# Patient Record
Sex: Female | Born: 1975 | Race: White | Hispanic: No | Marital: Married | Smoking: Never smoker
Health system: Southern US, Community
[De-identification: ages and names within clinical notes are randomized; demographics above are authoritative.]

## PROBLEM LIST (undated history)

## (undated) DIAGNOSIS — N200 Calculus of kidney: Secondary | ICD-10-CM

## (undated) DIAGNOSIS — R000 Tachycardia, unspecified: Secondary | ICD-10-CM

## (undated) DIAGNOSIS — A6920 Lyme disease, unspecified: Secondary | ICD-10-CM

## (undated) DIAGNOSIS — B2790 Infectious mononucleosis, unspecified without complication: Secondary | ICD-10-CM

## (undated) DIAGNOSIS — G510 Bell's palsy: Secondary | ICD-10-CM

## (undated) HISTORY — DX: Bell's palsy: G51.0

## (undated) HISTORY — PX: LASIK: SHX215

## (undated) HISTORY — DX: Calculus of kidney: N20.0

## (undated) HISTORY — DX: Lyme disease, unspecified: A69.20

## (undated) HISTORY — PX: OTHER SURGICAL HISTORY: SHX169

## (undated) HISTORY — DX: Infectious mononucleosis, unspecified without complication: B27.90

## (undated) HISTORY — PX: NEPHRECTOMY: SHX65

---

## 2004-06-14 ENCOUNTER — Ambulatory Visit (HOSPITAL_COMMUNITY): Admission: RE | Admit: 2004-06-14 | Discharge: 2004-06-14 | Payer: Self-pay | Admitting: Urology

## 2004-07-29 ENCOUNTER — Other Ambulatory Visit: Admission: RE | Admit: 2004-07-29 | Discharge: 2004-07-29 | Payer: Self-pay | Admitting: Obstetrics and Gynecology

## 2006-05-11 ENCOUNTER — Ambulatory Visit (HOSPITAL_BASED_OUTPATIENT_CLINIC_OR_DEPARTMENT_OTHER): Admission: RE | Admit: 2006-05-11 | Discharge: 2006-05-11 | Payer: Self-pay | Admitting: Urology

## 2006-12-26 ENCOUNTER — Ambulatory Visit: Payer: Self-pay | Admitting: Cardiovascular Disease

## 2006-12-26 ENCOUNTER — Other Ambulatory Visit: Payer: Self-pay | Admitting: Emergency Medicine

## 2006-12-26 ENCOUNTER — Inpatient Hospital Stay (HOSPITAL_COMMUNITY): Admission: AD | Admit: 2006-12-26 | Discharge: 2006-12-27 | Payer: Self-pay | Admitting: Obstetrics and Gynecology

## 2006-12-28 ENCOUNTER — Encounter: Payer: Self-pay | Admitting: Cardiovascular Disease

## 2006-12-28 ENCOUNTER — Ambulatory Visit: Payer: Self-pay

## 2006-12-31 ENCOUNTER — Ambulatory Visit: Payer: Self-pay | Admitting: Cardiovascular Disease

## 2007-01-14 ENCOUNTER — Ambulatory Visit: Payer: Self-pay | Admitting: Cardiovascular Disease

## 2007-02-04 ENCOUNTER — Inpatient Hospital Stay (HOSPITAL_COMMUNITY): Admission: AD | Admit: 2007-02-04 | Discharge: 2007-02-07 | Payer: Self-pay | Admitting: Obstetrics and Gynecology

## 2008-08-21 ENCOUNTER — Ambulatory Visit: Payer: Self-pay | Admitting: Cardiology

## 2008-11-25 ENCOUNTER — Telehealth (INDEPENDENT_AMBULATORY_CARE_PROVIDER_SITE_OTHER): Payer: Self-pay | Admitting: *Deleted

## 2008-11-30 ENCOUNTER — Telehealth: Payer: Self-pay | Admitting: Cardiology

## 2008-11-30 ENCOUNTER — Telehealth (INDEPENDENT_AMBULATORY_CARE_PROVIDER_SITE_OTHER): Payer: Self-pay | Admitting: Physician Assistant

## 2008-12-04 ENCOUNTER — Encounter: Payer: Self-pay | Admitting: Cardiology

## 2008-12-26 ENCOUNTER — Inpatient Hospital Stay (HOSPITAL_COMMUNITY): Admission: AD | Admit: 2008-12-26 | Discharge: 2008-12-26 | Payer: Self-pay | Admitting: Obstetrics and Gynecology

## 2009-01-12 ENCOUNTER — Inpatient Hospital Stay (HOSPITAL_COMMUNITY): Admission: AD | Admit: 2009-01-12 | Discharge: 2009-01-14 | Payer: Self-pay | Admitting: Obstetrics and Gynecology

## 2009-01-30 DEATH — deceased

## 2010-01-30 NOTE — L&D Delivery Note (Signed)
Delivery Note At 4:14 PM a viable female was delivered via Vaginal, Spontaneous Delivery (Presentation: Left Occiput Anterior).  APGAR: 9, 9; weight 6 lb 10.2 oz (3011 g).   Placenta status: Intact, Spontaneous.  Cord: 3 vessels with the following complications: None.  Cord pH and carolinas cord collection done.  Anesthesia: Epidural  Episiotomy: None Lacerations: None Suture Repair:  Est. Blood Loss (mL): 300  Mom to postpartum.  Baby to nursery-stable.  Brianna Alvarez C 09/01/2010, 6:01 PM

## 2010-05-02 LAB — CBC
HCT: 22.9 % — ABNORMAL LOW (ref 36.0–46.0)
Hemoglobin: 7.6 g/dL — ABNORMAL LOW (ref 12.0–15.0)
Platelets: 191 10*3/uL (ref 150–400)
RBC: 2.48 MIL/uL — ABNORMAL LOW (ref 3.87–5.11)
WBC: 8.3 10*3/uL (ref 4.0–10.5)

## 2010-05-03 LAB — CBC
HCT: 31.2 % — ABNORMAL LOW (ref 36.0–46.0)
Hemoglobin: 10.5 g/dL — ABNORMAL LOW (ref 12.0–15.0)
Hemoglobin: 9.7 g/dL — ABNORMAL LOW (ref 12.0–15.0)
RBC: 3.19 MIL/uL — ABNORMAL LOW (ref 3.87–5.11)
RDW: 13.9 % (ref 11.5–15.5)
WBC: 12.5 10*3/uL — ABNORMAL HIGH (ref 4.0–10.5)

## 2010-05-03 LAB — CYTOMEGALOVIRUS ANTIBODY, IGG: Cytomegalovirus(CMV) Antibody, IgG: POSITIVE — AB

## 2010-05-03 LAB — KLEIHAUER-BETKE STAIN: # Vials RhIg: 2

## 2010-05-03 LAB — HSV 1 ANTIBODY, IGG: HSV 1 Glycoprotein G Ab, IgG: <0.01 IV

## 2010-05-03 LAB — T3 UPTAKE: T3 Uptake Ratio: 18.8 % — ABNORMAL LOW (ref 22.5–37.0)

## 2010-05-03 LAB — LUPUS ANTICOAGULANT PANEL: PTT Lupus Anticoagulant: 38.7 secs (ref 32.0–43.4)

## 2010-05-03 LAB — T4, FREE: Free T4: 0.91 ng/dL (ref 0.80–1.80)

## 2010-05-03 LAB — TSH: TSH: 2.623 u[IU]/mL (ref 0.350–4.500)

## 2010-05-03 LAB — CMV IGM: CMV IgM: 8.2 AU/mL (ref <30.0)

## 2010-05-03 LAB — T4: T4, Total: 10.6 ug/dL (ref 5.0–12.5)

## 2010-06-14 NOTE — Consult Note (Signed)
NAMEAKASIA, AHMAD           ACCOUNT NO.:  [id number]   MEDICAL RECORD NO.:  [id number]          PATIENT TYPE:  EMS   LOCATION:  ED                           FACILITY:  [facility]   PHYSICIAN:  Noralyn Pick. Eden Emms, MD, FACCDATE OF BIRTH:  [birth year]   DATE OF CONSULTATION:  DATE OF DISCHARGE:                                 CONSULTATION   HISTORY:  Ms. Anagnos is a 35 year old white female who was referred to  the emergency room by her OB/GYN secondary to reoccurring palpitations.  Ms. Espinoza describes at least a 2-week history of tachy palpitations.  The first occurred approximately 2 weeks ago and awakened her from  sleep.  She noticed a heart rate of approximately 160, and it was  regular.  She described impending doom and was very anxious.  She did  not have any chest discomfort, but she did have nausea, diaphoresis and  shortness of breath.  She was unable to lie down.  After vomiting once  and a vagal maneuver, her heart rate decreased to approximately 115.  This lasted approximately 20 minutes.   While at work today at [employer] she stated that while walking  down the hall she suddenly felt her legs becoming very, very weak.  She  was also diaphoretic, nauseated and short of breath.  She checked her  heart rate, and it was 155.  With rest, heart rate went down to the 110s  in about 5 minutes; however, symptoms occurred after lunch beginning  with a heart rate in the 140s.  Dr. Earnestine Leys briefly reviewed her EKG and  felt that she had a sinus tachycardia.  She spoke with her OB/GYN on the  phone, and she referred the patient to one of [facility] emergency  rooms for cardiac evaluation.   Since arrival at [facility] emergency room, her heart rate has been  maintained between the 90s and 110; however, nursing noted that with  ambulating to the bathroom her heart rate would increase up into the  140s without any specific symptoms.  In the emergency room, she has been  placed on a fetal monitor.   PAST MEDICAL HISTORY:  Allergies include sulfa.  Only medications prior  to presentation included prenatal multivitamin daily.  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  This is her first  pregnancy, and she plans on breast feeding.   SOCIAL HISTORY:  She resides in [place] with her husband.  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  She  does not have any other children.  She denies any tobacco, alcohol,  drugs or herbal medications or specific diet.  She states that prior to  her pregnancy she exercised regularly; however, this has significantly  decreased in the last 2 months.   FAMILY HISTORY:  Her mother is alive at 52 with a history of SVT.  Her  father at 42 alive and well, brother alive and well.  REVIEW OF SYSTEMS:  Notable for a 22-pound weight gain since her  pregnancy, rare dyspnea on exertion since her pregnancy, urinary  frequency.  Last menstrual period was on May 15, 2006 and she  described GERD system symptoms during her second trimester.  All other  points besides those mentioned above are unremarkable.   PHYSICAL EXAM:  GENERAL:  Well-nourished well-developed pleasant white  female in no apparent distress.  Husband is present.  VITAL SIGNS:  Temperature is 97.3, blood pressure 110/88, pulse is 103, respirations  20, weight 169.7.  HEENT:  Unremarkable.  NECK:  Supple without thyromegaly, adenopathy, JVD, carotid bruit.  CHEST:  Symmetrical excursion.  Clear to auscultation bilaterally  without rales, rhonchi or wheezing.  It is noted with sitting up her  heart rate increases up into the 120s.  HEART:  PMI is not displaced.  Regular rate and rhythm.  I do not  appreciate any murmurs, rubs, clicks or gallops.  All pulses are  symmetrical  and intact without femoral bruit.  SKIN:  Integument appears to be intact.  ABDOMEN:  Gravida, bowel sounds present.  EXTREMITIES:  Negative cyanosis, clubbing or edema.  MUSCULOSKELETAL:  Grossly unremarkable.   A chest x-ray has not been performed.  EKG in the emergency room shows  normal sinus rhythm with a ventricular rate of 96, normal axis,  nonspecific ST-T wave changes, early R normal intervals.  No indications  for WPW or reentry tachycardia at this time.  H&H was 9.9 and 28.3  respectively with normal indices, platelets 239, WBCs 11.0, sodium 137,  potassium 3.5, BUN 7, creatinine 0.72, glucose 91, normal LFTs.  TSH and  free T4 are pending at the time of this dictation.  The patient also  notes that her hemoglobin was 11.1 approximately 5 weeks ago.   IMPRESSION:  1. Symptomatic SVT as described above.  2. Normocytic anemia, decreased per patient's report.  33 weeks      gravida.   DISPOSITION:  Dr. Eden Emms reviewed the patient's history, spoke with and  examined the patient and agrees with the above.  Given her SVT and low  hemoglobin, it is felt that she should be observed overnight.  Given her  pregnancy, it is felt that she would best be admitted to [facility]  for observation.  I have contacted Dr. Vincente Poli, and she has agreed to  accept the patient to a telemetry bed at [facility] for overnight  observation.  TSH and free T4 are pending at the time of this dictation.  For refractory tachycardia that does not subside with rest, severely  symptomatic could use adenosine or beta blockers; however, we will not  initiate a beta blocker at this time.  I have left a message at the  office to arrange an outpatient echocardiogram for Friday in our office  and also for an event monitor to be placed with a followup with Dr.  Eden Emms in [place] per patient's request.      Joellyn Rued, PA-C      Noralyn Pick. Eden Emms, MD, [facility]  Electronically Signed    EW/MEDQ   D:  12/26/2006  T:  12/27/2006  Job:  [id number]   cc:   Noralyn Pick. Eden Emms, MD, [facility]  [address]  [address]  [place]  [place] [address]   Dineen Kid. Rana Snare, M.D.  Fax: [phone]

## 2010-06-14 NOTE — Assessment & Plan Note (Signed)
[facility]                            CARDIOLOGY OFFICE NOTE   JANCY, SPRANKLE                    MRN:          [id number]  DATE:01/14/2007                            DOB:          [birth year]    Brianna Alvarez returns today for followup.  She has had tachy palpitations.  She is [redacted] weeks pregnant.  I believe she is going to have her baby at  around 38 weeks, since the baby is large.  She has had significant  palpitations.  She has had an event monitor.  They all appear to be  sinus tachycardia.  There is no evidence of PSVT, PAF, or significant  arrhythmias.  She is somewhat anemic and was placed on iron.  Her  thyroid function testing is normal.   She continues to work.  In fact, today she needs to go up to do stress  test with Dr. Andee Lineman at [facility].   She has cut bat to about a day-and-a-half a week.   I explained to Brianna Alvarez that I did not think any of her arrhythmias  were abnormal in mechanism.  I prefer not to start her on beta blockers,  although her OB/GYN doctor said it was okay.   I think she prefers not to be on beta blockers either.  Today in the  office I did check her pulse at rest.  It was 93.  With just a slight  walk up and down the hall, she went into sinus tachycardia at 120 and  there was a slow acceleration and slow deceleration.  This is consistent  with a benign mechanism.   She has not had any significant pre-syncope.   REVIEW OF SYSTEMS:  Remarkable for fatigue only.   Her current weight is 168, blood pressure 110/65.  She is not postural.  Resting heart rate is in the 90s.  HEENT:  Unremarkable.  Carotids normal without bruit.  No lymphadenopathy, thyromegaly, or JVP  elevation.  LUNGS:  Clear.  Good diaphragmatic motion.  No wheezing.  S1, S2 with no venous hum or abnormal murmurs.  ABDOMEN:  Protuberant consistent with gestational age.  Distal pulses  intact.  No edema.  NEURO:  Nonfocal.  No muscular  weakness.  SKIN:  Warm and dry.   Only medications include prenatal vitamins and iron.   She is allergic to SULFA.   IMPRESSION:  1. Palpitations in the setting of pregnancy.  Sinus tachycardia      consistent with volume overload and anemia.  No indication for beta      blockers.  2. [redacted] weeks pregnant in the setting of tachy palpitations.  I      discussed with OB/GYN doctor.  The patient likely to have her baby      2 weeks early given the size of the baby.  She knows to lie on her      left side if she were to be presyncopal to increase venous return.  3. Anemia in the setting of pregnancy, some of it likely dilutional.      Continue iron therapy.  Follow up with primary  care doctor.   She has had a 2D echocardiogram, which was normal and showed no evidence  of structural heart disease.  All of her transtelephonic monitoring was  also reviewed, approximate time 10 minutes.   I told Brianna Alvarez I thought she would be fine off of medications.  She  will have her baby in about 3 weeks and I will see her in 6 to 8 weeks.     Noralyn Pick. Eden Emms, MD, [facility]  Electronically Signed    PCN/MedQ  DD: 01/14/2007  DT: 01/14/2007  Job #: [id number]

## 2010-06-14 NOTE — Assessment & Plan Note (Signed)
[facility]                                 ON-CALL NOTE   Brianna, Alvarez                    MRN:          [id number]  DATE:01/04/2007                            DOB:          1975-11-08    PRIMARY PHYSICIAN:  Charlton Haws.   TIME OF CALL:  0005 hours.   I received a telephone call from [facility] to tell me  that [name] contacted them.  Per their report, the patient  was awakened by rapid heartbeat.  The patient is wearing a monitor  staffed by the [facility].  The monitoring service staff tell me  that this tracing demonstrated a regular narrow complex tachycardia that  appeared to be sinus tachycardia with a heart rate ranging from 99 to  156 beats per minute.  The patient had a followup tracing that  demonstrated sinus tachycardia at a rate of 115 beats per minute.  The  patient monitoring service tells me that the patient reported feeling  otherwise well, just concerned about her rapid heart beat.   I attempted to contact the patient at the telephone number listed in the  [facility] record system, as well as the phone number on file with [facility], however, there was an answering machine that answered and I did  not leave a message.     Lanell Matar, DO  Electronically Signed    KRD/MedQ  DD: 01/04/2007  DT: 01/04/2007  Job #: [id number]

## 2010-06-17 NOTE — Op Note (Signed)
Brianna Alvarez, Brianna Alvarez           ACCOUNT NO.:  [id number]   MEDICAL RECORD NO.:  [id number]          PATIENT TYPE:  AMB   LOCATION:  [facility]                         FACILITY:  [facility]   PHYSICIAN:  Bertram Millard. 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   PRINCIPAL PROCEDURE:  Cysto, left retrograde ureterogram, cauterization  of left ureter and ureteral orifice.   SURGEON:  Bertram Millard. Dahlstedt, M.D.   ANESTHESIA:  General with LMA.   COMPLICATIONS:  None.   BRIEF HISTORY:  A 35 year old female who had a left nephrectomy in the  recent past by Dr. Katina Dung, in [place].  She had a nonfunctioning  chronically infected, small left kidney.  On the table during that  laparoscopic procedure, she had a cystogram which revealed no evidence  of reflux.  She had had no evidence for reflux in the past as well.  She  had a nuclear medicine VCUG done of upon presentation for recurrent  urinary tract infections and left flank pain.  She was found to have  reflux into her blind ending left ureter.  As the patient has had  persistent symptoms, recurrent urinary tract infections and unilateral  pain on that left side, it was recommended that we eventually do  something to ameliorate these infections.  This includes left  ureterectomy, placement of Deflux in her intertrigonal area or possible  cauterization of her left ureter and orifice.  I have consulted several  of my partners regarding treatment options.  It was recommended that we  try the latter of these, as it is more cost effective at the present  time and least invasive.  Risks and complications of cysto and  cauterization were discussed with the patient.  She understands these  and desired to proceed.   DESCRIPTION OF PROCEDURE:  Mr. Pajak was administered preoperative IV  antibiotics, her surgical side marked in the holding area and she was  taken to the operating room where general anesthetic was administered  using LMA.  She was placed in the dorsal lithotomy position.  Genitalia  and perineum were prepped and draped.  A 22-French panendoscope was  advanced into her bladder.  Ureteral orifices were normal in  configuration and location.  Clear urine was seen effluxing from the  right side.  No bladder lesions were noted.  An open-end catheter was  used to perform a retrograde.  This revealed a normal-appearing left  ureter with the blind end up near some clips that were present  radiographically.  I then passed a thin Bugbee electrode under  radiographic guidance all the way up to this area.  I turned the cautery  unit up to 60, and stepped on the pedal.  I slowly withdrew the paddle  and under fluoroscopic guidance was able to cauterize this tube all the  way down to the bladder.  The orifice was cauterized significantly, both  within the orifice an outside of it.  This created a nice contracted  ureteral orifice.   At this point, the procedure was terminated.  The patient was  administered Toradol, bladder was drained and the scope removed.  She  was taken to PACU in stable condition.  She will follow-up in 2 weeks.      Bertram Millard. Dahlstedt, M.D.  Electronically Signed     SMD/MEDQ  D:  05/11/2006  T:  05/11/2006  Job:  [id number]

## 2010-09-01 ENCOUNTER — Encounter (HOSPITAL_COMMUNITY): Payer: Self-pay | Admitting: Anesthesiology

## 2010-09-01 ENCOUNTER — Inpatient Hospital Stay (HOSPITAL_COMMUNITY)
Admission: RE | Admit: 2010-09-01 | Discharge: 2010-09-03 | DRG: 775 | Disposition: A | Discharge status: Home / Self Care | Payer: PRIVATE HEALTH INSURANCE | Source: Ambulatory Visit | Attending: Obstetrics and Gynecology | Admitting: Obstetrics and Gynecology

## 2010-09-01 ENCOUNTER — Encounter (HOSPITAL_COMMUNITY): Payer: Self-pay

## 2010-09-01 ENCOUNTER — Inpatient Hospital Stay (HOSPITAL_COMMUNITY): Admission: RE | Admit: 2010-09-01 | Payer: Self-pay | Source: Ambulatory Visit

## 2010-09-01 ENCOUNTER — Inpatient Hospital Stay (HOSPITAL_COMMUNITY): Payer: PRIVATE HEALTH INSURANCE | Admitting: Anesthesiology

## 2010-09-01 DIAGNOSIS — Z22330 Carrier of Group B streptococcus: Secondary | ICD-10-CM

## 2010-09-01 DIAGNOSIS — R000 Tachycardia, unspecified: Secondary | ICD-10-CM | POA: Diagnosis present

## 2010-09-01 DIAGNOSIS — O99892 Other specified diseases and conditions complicating childbirth: Principal | ICD-10-CM | POA: Diagnosis present

## 2010-09-01 HISTORY — DX: Tachycardia, unspecified: R00.0

## 2010-09-01 LAB — TYPE AND SCREEN: Antibody Screen: NEGATIVE

## 2010-09-01 LAB — CBC
Hemoglobin: 10.2 g/dL — ABNORMAL LOW (ref 12.0–15.0)
MCH: 30.4 pg (ref 26.0–34.0)
MCHC: 33.3 g/dL (ref 30.0–36.0)
Platelets: 188 10*3/uL (ref 150–400)
RDW: 13.5 % (ref 11.5–15.5)

## 2010-09-01 LAB — ABO/RH: RH Type: POSITIVE

## 2010-09-01 LAB — HIV ANTIBODY (ROUTINE TESTING W REFLEX): HIV: NONREACTIVE

## 2010-09-01 LAB — HEPATITIS B SURFACE ANTIGEN: Hepatitis B Surface Ag: NEGATIVE

## 2010-09-01 LAB — RPR: RPR: NONREACTIVE

## 2010-09-01 MED ORDER — WITCH HAZEL-GLYCERIN EX PADS: 1.0000 "application " | MEDICATED_PAD | CUTANEOUS | Status: DC | PRN

## 2010-09-01 MED ORDER — AMPICILLIN SODIUM 2 G IJ SOLR: 2.0000 g | Freq: Four times a day (QID) | INTRAMUSCULAR | Status: DC

## 2010-09-01 MED ORDER — ACETAMINOPHEN 325 MG PO TABS
650.0000 mg | ORAL_TABLET | ORAL | Status: DC | PRN
  Administered 2010-09-01: 650 mg via ORAL
  Filled 2010-09-01: qty 2

## 2010-09-01 MED ORDER — LACTATED RINGERS IV SOLN
INTRAVENOUS | Status: DC
  Administered 2010-09-01: 125 mL/h via INTRAVENOUS

## 2010-09-01 MED ORDER — ONDANSETRON HCL 4 MG/2ML IJ SOLN
4.0000 mg | Freq: Four times a day (QID) | INTRAMUSCULAR | Status: DC | PRN
  Administered 2010-09-01 (×2): 4 mg via INTRAVENOUS
  Filled 2010-09-01 (×2): qty 2

## 2010-09-01 MED ORDER — NALBUPHINE SYRINGE 5 MG/0.5 ML
5.0000 mg | INJECTION | INTRAMUSCULAR | Status: DC | PRN
  Filled 2010-09-01: qty 0.5

## 2010-09-01 MED ORDER — SODIUM CHLORIDE 0.9 % IV SOLN
2.0000 g | Freq: Four times a day (QID) | INTRAVENOUS | Status: DC
  Administered 2010-09-01 (×2): 2 g via INTRAVENOUS
  Filled 2010-09-01 (×4): qty 2000

## 2010-09-01 MED ORDER — LANOLIN HYDROUS EX OINT: TOPICAL_OINTMENT | CUTANEOUS | Status: DC | PRN

## 2010-09-01 MED ORDER — OXYTOCIN 20 UNITS IN LACTATED RINGERS INFUSION - SIMPLE
125.0000 mL/h | Freq: Once | INTRAVENOUS | Status: AC
  Administered 2010-09-01: 999 mL/h via INTRAVENOUS
  Filled 2010-09-01: qty 1000

## 2010-09-01 MED ORDER — TERBUTALINE SULFATE 1 MG/ML IJ SOLN: 0.2500 mg | Freq: Once | INTRAMUSCULAR | Status: DC | PRN

## 2010-09-01 MED ORDER — DIPHENHYDRAMINE HCL 50 MG/ML IJ SOLN: 12.5000 mg | INTRAMUSCULAR | Status: DC | PRN

## 2010-09-01 MED ORDER — LACTATED RINGERS IV SOLN
500.0000 mL | Freq: Once | INTRAVENOUS | Status: AC
  Administered 2010-09-01: 500 mL via INTRAVENOUS

## 2010-09-01 MED ORDER — ONDANSETRON HCL 4 MG PO TABS: 4.0000 mg | ORAL_TABLET | ORAL | Status: DC | PRN

## 2010-09-01 MED ORDER — OXYCODONE-ACETAMINOPHEN 5-325 MG PO TABS: 2.0000 | ORAL_TABLET | ORAL | Status: DC | PRN

## 2010-09-01 MED ORDER — FLEET ENEMA 7-19 GM/118ML RE ENEM: 1.0000 | ENEMA | RECTAL | Status: DC | PRN

## 2010-09-01 MED ORDER — OXYCODONE-ACETAMINOPHEN 5-325 MG PO TABS: 1.0000 | ORAL_TABLET | ORAL | Status: DC | PRN

## 2010-09-01 MED ORDER — CITRIC ACID-SODIUM CITRATE 334-500 MG/5ML PO SOLN: 30.0000 mL | ORAL | Status: DC | PRN

## 2010-09-01 MED ORDER — ZOLPIDEM TARTRATE 5 MG PO TABS: 5.0000 mg | ORAL_TABLET | Freq: Every evening | ORAL | Status: DC | PRN

## 2010-09-01 MED ORDER — PHENYLEPHRINE 40 MCG/ML (10ML) SYRINGE FOR IV PUSH (FOR BLOOD PRESSURE SUPPORT)
80.0000 ug | PREFILLED_SYRINGE | INTRAVENOUS | Status: DC | PRN
  Filled 2010-09-01: qty 5

## 2010-09-01 MED ORDER — SIMETHICONE 80 MG PO CHEW: 80.0000 mg | CHEWABLE_TABLET | ORAL | Status: DC | PRN

## 2010-09-01 MED ORDER — FENTANYL 2.5 MCG/ML BUPIVACAINE 1/10 % EPIDURAL INFUSION (WH - ANES)
14.0000 mL/h | INTRAMUSCULAR | Status: DC
  Administered 2010-09-01 (×2): 14 mL/h via EPIDURAL
  Filled 2010-09-01 (×2): qty 60

## 2010-09-01 MED ORDER — METOPROLOL TARTRATE 25 MG PO TABS
25.0000 mg | ORAL_TABLET | Freq: Two times a day (BID) | ORAL | Status: DC
  Filled 2010-09-01 (×3): qty 1

## 2010-09-01 MED ORDER — OXYTOCIN 20 UNITS IN LACTATED RINGERS INFUSION - SIMPLE
2.0000 m[IU]/min | INTRAVENOUS | Status: DC
  Administered 2010-09-01: 2 m[IU]/min via INTRAVENOUS

## 2010-09-01 MED ORDER — PRENATAL PLUS 27-1 MG PO TABS
1.0000 | ORAL_TABLET | Freq: Every day | ORAL | Status: DC
  Administered 2010-09-03: 1 via ORAL
  Filled 2010-09-01: qty 1

## 2010-09-01 MED ORDER — OXYTOCIN 20 UNITS IN LACTATED RINGERS INFUSION - SIMPLE: 125.0000 mL/h | INTRAVENOUS | Status: DC | PRN

## 2010-09-01 MED ORDER — MEDROXYPROGESTERONE ACETATE 150 MG/ML IM SUSP: 150.0000 mg | INTRAMUSCULAR | Status: DC | PRN

## 2010-09-01 MED ORDER — BENZOCAINE-MENTHOL 20-0.5 % EX AERO: 1.0000 "application " | INHALATION_SPRAY | CUTANEOUS | Status: DC | PRN

## 2010-09-01 MED ORDER — BENZOCAINE-MENTHOL 20-0.5 % EX AERO
INHALATION_SPRAY | CUTANEOUS | Status: AC
  Filled 2010-09-01: qty 56

## 2010-09-01 MED ORDER — IBUPROFEN 600 MG PO TABS
600.0000 mg | ORAL_TABLET | Freq: Four times a day (QID) | ORAL | Status: DC
  Administered 2010-09-01 – 2010-09-03 (×8): 600 mg via ORAL
  Filled 2010-09-01 (×8): qty 1

## 2010-09-01 MED ORDER — LIDOCAINE HCL (PF) 1 % IJ SOLN
30.0000 mL | INTRAMUSCULAR | Status: DC | PRN
  Filled 2010-09-01 (×2): qty 30

## 2010-09-01 MED ORDER — EPHEDRINE 5 MG/ML INJ
10.0000 mg | INTRAVENOUS | Status: DC | PRN
  Filled 2010-09-01 (×2): qty 4

## 2010-09-01 MED ORDER — PHENYLEPHRINE 40 MCG/ML (10ML) SYRINGE FOR IV PUSH (FOR BLOOD PRESSURE SUPPORT)
80.0000 ug | PREFILLED_SYRINGE | INTRAVENOUS | Status: DC | PRN
  Filled 2010-09-01 (×2): qty 5

## 2010-09-01 MED ORDER — MEASLES, MUMPS & RUBELLA VAC ~~LOC~~ INJ
0.5000 mL | INJECTION | Freq: Once | SUBCUTANEOUS | Status: DC
  Filled 2010-09-01: qty 0.5

## 2010-09-01 MED ORDER — SENNOSIDES-DOCUSATE SODIUM 8.6-50 MG PO TABS
2.0000 | ORAL_TABLET | Freq: Every day | ORAL | Status: DC
  Administered 2010-09-01 – 2010-09-02 (×2): 2 via ORAL

## 2010-09-01 MED ORDER — LACTATED RINGERS IV SOLN
500.0000 mL | INTRAVENOUS | Status: DC | PRN
  Administered 2010-09-01: 300 mL via INTRAVENOUS

## 2010-09-01 MED ORDER — ONDANSETRON HCL 4 MG/2ML IJ SOLN: 4.0000 mg | INTRAMUSCULAR | Status: DC | PRN

## 2010-09-01 MED ORDER — IBUPROFEN 600 MG PO TABS: 600.0000 mg | ORAL_TABLET | Freq: Four times a day (QID) | ORAL | Status: DC | PRN

## 2010-09-01 MED ORDER — DIPHENHYDRAMINE HCL 25 MG PO CAPS: 25.0000 mg | ORAL_CAPSULE | Freq: Four times a day (QID) | ORAL | Status: DC | PRN

## 2010-09-01 MED ORDER — DIBUCAINE 1 % RE OINT: 1.0000 "application " | TOPICAL_OINTMENT | RECTAL | Status: DC | PRN

## 2010-09-01 MED ORDER — EPHEDRINE 5 MG/ML INJ
10.0000 mg | INTRAVENOUS | Status: DC | PRN
  Filled 2010-09-01: qty 4

## 2010-09-01 MED ORDER — TETANUS-DIPHTH-ACELL PERTUSSIS 5-2.5-18.5 LF-MCG/0.5 IM SUSP: 0.5000 mL | Freq: Once | INTRAMUSCULAR | Status: DC

## 2010-09-01 NOTE — H&P (Signed)
Brianna Alvarez is a 35 y.o. female presenting for induction of labor.  Her pregnancy has been complicated by maternal tachycardia which has required limited physical activity and bedrest as well as prn atenolol.  Her last pregnancy was complicated by a 37 wk stillbirth.  She has been monitored weekly with NSTs and monthly ultrasounds and presents for IOL.  This was recommended by me, the patient requested this and MFM facutly has also recommended this.  She is also GBS positive. History OB History    Grav Para Term Preterm Abortions TAB SAB Ect Mult Living   3 2 2  0 0 0 0 0 0 1     Past Medical History  Diagnosis Date  . Tachycardia    Past Surgical History  Procedure Date  . Left nephronectomy    Family History: family history is not on file. Social History:  reports that she has never smoked. She has never used smokeless tobacco. She reports that she does not drink alcohol or use illicit drugs.  ROS    Blood pressure 106/66, pulse 117, temperature 98.3 F (36.8 C), resp. rate 20, height 5\' 6"  (1.676 m), weight 76.658 kg (169 lb), last menstrual period 12/16/2009. Exam Physical Exam  Prenatal labs: ABO, Rh:   Antibody: Negative (08/02 0000) Rubella:   RPR: Nonreactive (08/02 0000)  HBsAg: Negative (08/02 0000)  HIV: Non-reactive (08/02 0000)  GBS:     Assessment/Plan: Term pregnancy with history of 37wks demise Maternal tachycardia Anxiety GBS postive- IV amp now and throughout labotr. Plan Pitocin, AROM and anticipated SVD.  Riskd of labor and prematurity were discussed at length and patient gives her informed consent.    Aidyn Kellis C 09/01/2010, 7:54 AM

## 2010-09-01 NOTE — Anesthesia Preprocedure Evaluation (Signed)
Anesthesia Evaluation  Name, MR# and DOB Patient awake  General Assessment Comment  Reviewed: Allergy & Precautions, H&P  and Patient's Chart, lab work & pertinent test results  Airway Mallampati: II TM Distance: >3 FB Neck ROM: full    Dental No notable dental hx    Pulmonaryneg pulmonary ROS    clear to auscultation  pulmonary exam normal   Cardiovascular regular Normal Sinus tachycardia with pregnancySinus tachycardia with pregnancy:    Neuro/PsychNegative Neurological ROS Negative Psych ROS  GI/Hepatic/Renal negative GI ROS, negative Liver ROS, and negative Renal ROS (+)       Endo/Other  Negative Endocrine ROS (+)   Abdominal   Musculoskeletal  Hematology negative hematology ROS (+)   Peds  Reproductive/Obstetrics (+) Pregnancy   Anesthesia Other Findings             Anesthesia Physical Anesthesia Plan  ASA: II  Anesthesia Plan: Epidural   Post-op Pain Management:    Induction:   Airway Management Planned:   Additional Equipment:   Intra-op Plan:   Post-operative Plan:   Informed Consent: I have reviewed the patients History and Physical, chart, labs and discussed the procedure including the risks, benefits and alternatives for the proposed anesthesia with the patient or authorized representative who has indicated his/her understanding and acceptance.     Plan Discussed with:   Anesthesia Plan Comments:         Anesthesia Quick Evaluation

## 2010-09-01 NOTE — Anesthesia Procedure Notes (Signed)
Epidural Patient location during procedure: OB Start time: 09/01/2010 11:25 AM  Staffing Performed by: anesthesiologist   Preanesthetic Checklist Completed: patient identified, site marked, surgical consent, pre-op evaluation, timeout performed, IV checked, risks and benefits discussed and monitors and equipment checked  Epidural Patient position: sitting Prep: site prepped and draped and DuraPrep Patient monitoring: continuous pulse ox and blood pressure Approach: midline Injection technique: LOR saline  Needle:  Needle type: Tuohy  Needle gauge: 17 G Needle length: 9 cm Needle insertion depth: 5 cm cm Catheter type: closed end flexible Catheter size: 19 Gauge Catheter at skin depth: 10 cm Test dose: negative  Assessment Events: blood not aspirated, injection not painful, no injection resistance, negative IV test and no paresthesia  Additional Notes Patient identified.  Risk benefits discussed including failed block, incomplete pain control, headache, nerve damage, paralysis, blood pressure changes, nausea, vomiting, reactions to medication both toxic or allergic, and postpartum back pain.  Patient expressed understanding and wished to proceed.  All questions were answered.  Sterile technique used throughout procedure and epidural site dressed with sterile barrier dressing. No paresthesia or other complications noted. 5 minutes prior to starting epidural infusion of fentanyl and bupivicaine, the epidural was loaded with 9 ml of 1.5% lidocaine in three separate doses.  The patient did not experience any signs of intravascular injection such as tinnitus or metallic taste in mouth nor signs of intrathecal spread such as rapid motor block. Please see nursing notes for vital signs.

## 2010-09-02 LAB — CBC
HCT: 27.6 % — ABNORMAL LOW (ref 36.0–46.0)
MCHC: 32.6 g/dL (ref 30.0–36.0)
RDW: 13.7 % (ref 11.5–15.5)

## 2010-09-02 NOTE — Progress Notes (Signed)
Mother pumping every 3 hrs. Mother has PIS at home. Support and encouragement gvien. Mother wanted information on taking Ambien , printed out info from Meds and [employer]. Ambien L-3.Inst mother to discuss with NICU Dr.

## 2010-09-02 NOTE — Progress Notes (Signed)
Post Partum Day 1 Subjective: up ad lib, voiding and tolerating PO, baby in NICU for transient tachypnea  Objective: Blood pressure 95/58, pulse 63, temperature 97.8 F (36.6 C), temperature source Oral, resp. rate 18, height 5\' 6"  (1.676 m), weight 76.658 kg (169 lb), last menstrual period 12/16/2009, SpO2 99.00%, unknown if currently breastfeeding.  Physical Exam:  General: alert and cooperative Lochia: appropriate Uterine Fundus: firm Perineum intact DVT Evaluation: No evidence of DVT seen on physical exam.   Basename 09/02/10 0510 09/01/10 0800  HGB 9.0* 10.2*  HCT 27.6* 30.6*    Assessment/Plan: Plan for discharge tomorrow   LOS: 1 day   CURTIS,CAROL G 09/02/2010, 8:43 AM

## 2010-09-03 NOTE — Anesthesia Postprocedure Evaluation (Signed)
Anesthesia Post Note  Patient: Brianna Alvarez  Procedure(s) Performed: * No procedures listed *  Anesthesia type: Epidural  Patient location: Mother/Baby  Post pain: Pain level controlled  Post assessment: Post-op Vital signs reviewed  Last Vitals:  Filed Vitals:   09/03/10 0513  BP: 96/62  Pulse: 65  Temp: 98 F (36.7 C)  Resp: 18    Post vital signs: Reviewed  Level of consciousness: awake  Complications: No apparent anesthesia complications

## 2010-09-03 NOTE — Progress Notes (Signed)
Post Partum Day 2 Subjective: no complaints  Objective: Blood pressure 96/62, pulse 65, temperature 98 F (36.7 C), temperature source Oral, resp. rate 18, height 5\' 6"  (1.676 m), weight 76.658 kg (169 lb), last menstrual period 12/16/2009, SpO2 97.00%, unknown if currently breastfeeding.  Physical Exam:  General: alert and cooperative Lochia: appropriate Uterine Fundus: firm DVT Evaluation: No evidence of DVT seen on physical exam.   Basename 09/02/10 0510 09/01/10 0800  HGB 9.0* 10.2*  HCT 27.6* 30.6*    Assessment/Plan: Discharge home Baby in NICU on CPAP Pt without complaints   LOS: 2 days   Nyree Applegate C 09/03/2010, 10:16 AM

## 2010-09-03 NOTE — Progress Notes (Signed)
Has PIS at home.  Explained how to use PIS while infant is in NICU.  Reports obtaining drops.  Encouragement given.  Educated to pump q2-3 hrs during day and at least once at night for a minimum of 8x/24hrs.  Engorgement prevention discussed.  Parents unsure of when infant will be d/c'd.  Encouraged to do skin-to-skin in NICU and explained importance of STS.  Encouraged to call for lactation support if needed in NICU for latching and after d/c'd if needed.

## 2010-09-03 NOTE — Discharge Summary (Signed)
Obstetric Discharge Summary Reason for Admission: induction of labor Prenatal Procedures: none Intrapartum Procedures: spontaneous vaginal delivery Postpartum Procedures: none Complications-Operative and Postpartum: none Hemoglobin  Date Value Range Status  09/02/2010 9.0* 12.0-15.0 (g/dL) Final     HCT  Date Value Range Status  09/02/2010 27.6* 36.0-46.0 (%) Final    Discharge Diagnoses: Term Pregnancy-delivered  Discharge Information: Date: 09/03/2010 Activity: pelvic rest Diet: routine Medications: PNV Condition: stable Instructions: refer to practice specific booklet Discharge to: home Follow-up Information    Follow up with Aadil Sur C, MD. Make an appointment in 6 weeks.   Contact information:   [address] [place] [address] [phone]          Newborn Data: Live born female  Birth Weight: 6 lb 10.2 oz (3011 g) APGAR: 9, 9  Home with Baby [place] is in NICU on CPAP for TTN.  Eddy Termine C 09/03/2010, 10:19 AM

## 2010-10-20 LAB — CBC
HCT: 27 — ABNORMAL LOW
MCHC: 35
Platelets: 206
RBC: 3.2 — ABNORMAL LOW
WBC: 10.1
WBC: 14.5 — ABNORMAL HIGH

## 2010-10-20 LAB — COMPREHENSIVE METABOLIC PANEL
ALT: 15
AST: 23
Albumin: 2 — ABNORMAL LOW
Alkaline Phosphatase: 101
BUN: 5 — ABNORMAL LOW
Chloride: 105
Potassium: 3.6
Total Bilirubin: 0.4

## 2010-10-20 LAB — RPR: RPR Ser Ql: NONREACTIVE

## 2010-11-08 LAB — CBC
HCT: 28.3 — ABNORMAL LOW
Hemoglobin: 9.9 — ABNORMAL LOW
MCHC: 34.9
RDW: 13.1

## 2010-11-08 LAB — COMPREHENSIVE METABOLIC PANEL
Alkaline Phosphatase: 95
BUN: 7
CO2: 25
Calcium: 8.2 — ABNORMAL LOW
GFR calc non Af Amer: >60
Glucose, Bld: 91
Total Protein: 5.8 — ABNORMAL LOW

## 2010-11-08 LAB — DIFFERENTIAL
Basophils Relative: 0
Lymphs Abs: 1.8
Monocytes Relative: 7
Neutro Abs: 8.4 — ABNORMAL HIGH
Neutrophils Relative %: 77

## 2010-11-08 LAB — URINALYSIS, ROUTINE W REFLEX MICROSCOPIC
Bilirubin Urine: NEGATIVE
Hgb urine dipstick: NEGATIVE
Ketones, ur: NEGATIVE
Protein, ur: NEGATIVE
Urobilinogen, UA: 1

## 2010-11-08 LAB — T4: T4, Total: 9.9

## 2010-11-08 LAB — RPR: RPR Ser Ql: NONREACTIVE

## 2010-11-08 LAB — TSH: TSH: 1.257

## 2013-10-03 ENCOUNTER — Other Ambulatory Visit: Payer: Self-pay | Admitting: Obstetrics and Gynecology

## 2013-10-07 LAB — CYTOLOGY - PAP

## 2013-12-01 ENCOUNTER — Encounter (HOSPITAL_COMMUNITY): Payer: Self-pay

## 2015-02-17 DIAGNOSIS — F4320 Adjustment disorder, unspecified: Secondary | ICD-10-CM | POA: Diagnosis not present

## 2015-06-29 DIAGNOSIS — N939 Abnormal uterine and vaginal bleeding, unspecified: Secondary | ICD-10-CM | POA: Diagnosis not present

## 2015-06-29 DIAGNOSIS — Z30432 Encounter for removal of intrauterine contraceptive device: Secondary | ICD-10-CM | POA: Diagnosis not present

## 2015-06-29 DIAGNOSIS — R102 Pelvic and perineal pain: Secondary | ICD-10-CM | POA: Diagnosis not present

## 2015-06-29 DIAGNOSIS — N951 Menopausal and female climacteric states: Secondary | ICD-10-CM | POA: Diagnosis not present

## 2015-06-29 DIAGNOSIS — Z1329 Encounter for screening for other suspected endocrine disorder: Secondary | ICD-10-CM | POA: Diagnosis not present

## 2015-06-29 DIAGNOSIS — N643 Galactorrhea not associated with childbirth: Secondary | ICD-10-CM | POA: Diagnosis not present

## 2015-09-30 DIAGNOSIS — Z30430 Encounter for insertion of intrauterine contraceptive device: Secondary | ICD-10-CM | POA: Diagnosis not present

## 2015-09-30 DIAGNOSIS — N841 Polyp of cervix uteri: Secondary | ICD-10-CM | POA: Diagnosis not present

## 2015-10-20 DIAGNOSIS — L811 Chloasma: Secondary | ICD-10-CM | POA: Diagnosis not present

## 2015-10-20 DIAGNOSIS — L700 Acne vulgaris: Secondary | ICD-10-CM | POA: Diagnosis not present

## 2015-12-02 DIAGNOSIS — Z23 Encounter for immunization: Secondary | ICD-10-CM | POA: Diagnosis not present

## 2016-02-28 MED FILL — ESCITALOPRAM 10 MG TABLET: 10 | 90 days supply | Qty: 90 | Fill #0

## 2016-03-15 DIAGNOSIS — D225 Melanocytic nevi of trunk: Secondary | ICD-10-CM | POA: Diagnosis not present

## 2016-03-15 DIAGNOSIS — L814 Other melanin hyperpigmentation: Secondary | ICD-10-CM | POA: Diagnosis not present

## 2016-03-15 DIAGNOSIS — D1800 Hemangioma unspecified site: Secondary | ICD-10-CM | POA: Diagnosis not present

## 2016-03-15 DIAGNOSIS — Z86018 Personal history of other benign neoplasm: Secondary | ICD-10-CM | POA: Diagnosis not present

## 2016-03-15 DIAGNOSIS — Z411 Encounter for cosmetic surgery: Secondary | ICD-10-CM | POA: Diagnosis not present

## 2016-04-27 DIAGNOSIS — F4320 Adjustment disorder, unspecified: Secondary | ICD-10-CM | POA: Diagnosis not present

## 2016-05-11 DIAGNOSIS — F4320 Adjustment disorder, unspecified: Secondary | ICD-10-CM | POA: Diagnosis not present

## 2016-05-25 DIAGNOSIS — F4320 Adjustment disorder, unspecified: Secondary | ICD-10-CM | POA: Diagnosis not present

## 2016-06-28 DIAGNOSIS — Z6823 Body mass index (BMI) 23.0-23.9, adult: Secondary | ICD-10-CM | POA: Diagnosis not present

## 2016-06-28 DIAGNOSIS — Z01419 Encounter for gynecological examination (general) (routine) without abnormal findings: Secondary | ICD-10-CM | POA: Diagnosis not present

## 2016-06-29 DIAGNOSIS — Z13220 Encounter for screening for lipoid disorders: Secondary | ICD-10-CM | POA: Diagnosis not present

## 2016-06-29 DIAGNOSIS — Z1329 Encounter for screening for other suspected endocrine disorder: Secondary | ICD-10-CM | POA: Diagnosis not present

## 2016-06-29 DIAGNOSIS — D509 Iron deficiency anemia, unspecified: Secondary | ICD-10-CM | POA: Diagnosis not present

## 2016-06-29 DIAGNOSIS — Z1231 Encounter for screening mammogram for malignant neoplasm of breast: Secondary | ICD-10-CM | POA: Diagnosis not present

## 2016-06-29 DIAGNOSIS — Z130 Encounter for screening for diseases of the blood and blood-forming organs and certain disorders involving the immune mechanism: Secondary | ICD-10-CM | POA: Diagnosis not present

## 2016-06-29 DIAGNOSIS — Z131 Encounter for screening for diabetes mellitus: Secondary | ICD-10-CM | POA: Diagnosis not present

## 2016-06-29 DIAGNOSIS — Z1321 Encounter for screening for nutritional disorder: Secondary | ICD-10-CM | POA: Diagnosis not present

## 2016-06-29 DIAGNOSIS — Z13228 Encounter for screening for other metabolic disorders: Secondary | ICD-10-CM | POA: Diagnosis not present

## 2016-10-25 DIAGNOSIS — Z23 Encounter for immunization: Secondary | ICD-10-CM | POA: Diagnosis not present

## 2016-11-07 ENCOUNTER — Telehealth: Payer: Self-pay | Admitting: Gastroenterology

## 2016-11-07 ENCOUNTER — Encounter: Payer: Self-pay | Admitting: Gastroenterology

## 2016-11-07 NOTE — Telephone Encounter (Signed)
Patty, Can you get in touch with her. She is a friend of mine, used to be a Marine scientist at [employer]. Her mother is a patient of mine and she has had numerous precancerous polyps. Brianna Alvarez needs a colonoscopy for strong family history of colon polyps. She turns 41 the end of November and so let's shoot for colonoscopy in early December sometime [place] will work great.

## 2016-11-08 NOTE — Telephone Encounter (Signed)
The pt has been scheduled for previsit and colon  

## 2017-01-02 DIAGNOSIS — M25572 Pain in left ankle and joints of left foot: Secondary | ICD-10-CM | POA: Diagnosis not present

## 2017-01-03 ENCOUNTER — Other Ambulatory Visit: Payer: Self-pay

## 2017-01-03 ENCOUNTER — Ambulatory Visit (AMBULATORY_SURGERY_CENTER): Payer: Self-pay | Admitting: *Deleted

## 2017-01-03 VITALS — Ht 66.0 in | Wt 149.8 lb

## 2017-01-03 DIAGNOSIS — Z800 Family history of malignant neoplasm of digestive organs: Secondary | ICD-10-CM

## 2017-01-03 MED ORDER — NA SULFATE-K SULFATE-MG SULF 17.5-3.13-1.6 GM/177ML PO SOLN: 1.0000 | Freq: Once | ORAL | 0 refills | Status: AC

## 2017-01-03 MED FILL — SUPREP BOWEL PREP KIT: 17.5-3.13-1 | 1 days supply | Qty: 354 | Fill #0

## 2017-01-03 NOTE — Progress Notes (Signed)
Denies allergies to eggs or soy products. Denies complications with sedation or anesthesia. Denies O2 use. Denies use of diet or weight loss medications.  Emmi instructions given for colonoscopy.  

## 2017-01-04 ENCOUNTER — Encounter: Payer: Self-pay | Admitting: Gastroenterology

## 2017-01-09 ENCOUNTER — Ambulatory Visit: Payer: PRIVATE HEALTH INSURANCE | Admitting: Gastroenterology

## 2017-01-10 ENCOUNTER — Encounter: Payer: Self-pay | Admitting: Gastroenterology

## 2017-01-10 ENCOUNTER — Other Ambulatory Visit: Payer: Self-pay

## 2017-01-10 ENCOUNTER — Ambulatory Visit (AMBULATORY_SURGERY_CENTER): Payer: 59 | Admitting: Gastroenterology

## 2017-01-10 VITALS — BP 99/56 | HR 62 | Temp 97.5°F | Resp 18 | Ht 66.0 in | Wt 149.0 lb

## 2017-01-10 DIAGNOSIS — Z8371 Family history of colonic polyps: Secondary | ICD-10-CM

## 2017-01-10 DIAGNOSIS — Z1211 Encounter for screening for malignant neoplasm of colon: Secondary | ICD-10-CM

## 2017-01-10 MED ORDER — SODIUM CHLORIDE 0.9 % IV SOLN: 500.0000 mL | INTRAVENOUS | Status: DC

## 2017-01-10 NOTE — Patient Instructions (Signed)
YOU HAD AN ENDOSCOPIC PROCEDURE TODAY AT THE [facility]:   Refer to the procedure report that was given to you for any specific questions about what was found during the examination.  If the procedure report does not answer your questions, please call your gastroenterologist to clarify.  If you requested that your care partner not be given the details of your procedure findings, then the procedure report has been included in a sealed envelope for you to review at your convenience later.  YOU SHOULD EXPECT: Some feelings of bloating in the abdomen. Passage of more gas than usual.  Walking can help get rid of the air that was put into your GI tract during the procedure and reduce the bloating. If you had a lower endoscopy (such as a colonoscopy or flexible sigmoidoscopy) you may notice spotting of blood in your stool or on the toilet paper. If you underwent a bowel prep for your procedure, you may not have a normal bowel movement for a few days.  Please Note:  You might notice some irritation and congestion in your nose or some drainage.  This is from the oxygen used during your procedure.  There is no need for concern and it should clear up in a day or so.  SYMPTOMS TO REPORT IMMEDIATELY:   Following lower endoscopy (colonoscopy or flexible sigmoidoscopy):  Excessive amounts of blood in the stool  Significant tenderness or worsening of abdominal pains  Swelling of the abdomen that is new, acute  Fever of 100F or higher  For urgent or emergent issues, a gastroenterologist can be reached at any hour by calling [phone].   DIET:  We do recommend a small meal at first, but then you may proceed to your regular diet.  Drink plenty of fluids but you should avoid alcoholic beverages for 24 hours.  MEDICATIONS:  Continue present medications.  ACTIVITY:  You should plan to take it easy for the rest of today and you should NOT DRIVE or use heavy machinery until tomorrow (because of the  sedation medicines used during the test).    FOLLOW UP: Our staff will call the number listed on your records the next business day following your procedure to check on you and address any questions or concerns that you may have regarding the information given to you following your procedure. If we do not reach you, we will leave a message.  However, if you are feeling well and you are not experiencing any problems, there is no need to return our call.  We will assume that you have returned to your regular daily activities without incident.  If any biopsies were taken you will be contacted by phone or by letter within the next 1-3 weeks.  Please call us at [phone] if you have not heard about the biopsies in 3 weeks.   Thank you for allowing us to provide for your healthcare needs today.   SIGNATURES/CONFIDENTIALITY: You and/or your care partner have signed paperwork which will be entered into your electronic medical record.  These signatures attest to the fact that that the information above on your After Visit Summary has been reviewed and is understood.  Full responsibility of the confidentiality of this discharge information lies with you and/or your care-partner. 

## 2017-01-10 NOTE — Op Note (Signed)
[facility] Patient Name: Brianna Alvarez Procedure Date: 01/10/2017 9:34 AM MRN: [id number] Endoscopist: Milus Banister , MD Age: 41 Referring MD:  Date of Birth: 08-31-1975 Gender: Female Account #: [id number] Procedure:                Colonoscopy Indications:              Colon cancer screening in patient at increased                            risk: Mother with multiple (15-20) precancerous                            polyps, genetic testing 2015 negative for known                            mutations Medicines:                Monitored Anesthesia Care Procedure:                Pre-Anesthesia Assessment:                           - Prior to the procedure, a History and Physical                            was performed, and patient medications and                            allergies were reviewed. The patient's tolerance of                            previous anesthesia was also reviewed. The risks                            and benefits of the procedure and the sedation                            options and risks were discussed with the patient.                            All questions were answered, and informed consent                            was obtained. Prior Anticoagulants: The patient has                            taken no previous anticoagulant or antiplatelet                            agents. ASA Grade Assessment: II - A patient with                            mild systemic disease. After reviewing the risks  and benefits, the patient was deemed in                            satisfactory condition to undergo the procedure.                           After obtaining informed consent, the colonoscope                            was passed under direct vision. Throughout the                            procedure, the patient's blood pressure, pulse, and                            oxygen saturations were monitored continuously. The                             Colonoscope was introduced through the anus and                            advanced to the the cecum, identified by                            appendiceal orifice and ileocecal valve. The                            colonoscopy was performed without difficulty. The                            patient tolerated the procedure well. The quality                            of the bowel preparation was excellent. The                            ileocecal valve, appendiceal orifice, and rectum                            were photographed. Scope In: 10:02:18 AM Scope Out: 10:15:12 AM Scope Withdrawal Time: 0 hours 9 minutes 44 seconds  Total Procedure Duration: 0 hours 12 minutes 54 seconds  Findings:                 The entire examined colon appeared normal on direct                            and retroflexion views. Complications:            No immediate complications. Estimated blood loss:                            None. Estimated Blood Loss:     Estimated blood loss: none. Impression:               - The entire examined colon is normal  on direct and                            retroflexion views.                           - No polyps or cancers. Recommendation:           - Patient has a contact number available for                            emergencies. The signs and symptoms of potential                            delayed complications were discussed with the                            patient. Return to normal activities tomorrow.                            Written discharge instructions were provided to the                            patient.                           - Resume previous diet.                           - Continue present medications.                           - Repeat colonoscopy in 10 years for screening                            purposes. Call if you have any concerns prior to                            then. Milus Banister, MD 01/10/2017  10:17:41 AM This report has been signed electronically.

## 2017-01-10 NOTE — Progress Notes (Signed)
Report given to PACU, vss 

## 2017-01-11 ENCOUNTER — Telehealth: Payer: Self-pay

## 2017-01-11 NOTE — Telephone Encounter (Signed)
  Follow up Call-  Call back number 01/10/2017  Post procedure Call Back phone  # [phone]  Permission to leave phone message Yes  Some recent data might be hidden     Patient questions:  Do you have a fever, pain , or abdominal swelling? No. Pain Score  0 *  Have you tolerated food without any problems? Yes.    Have you been able to return to your normal activities? Yes.    Do you have any questions about your discharge instructions: Diet   No. Medications  No. Follow up visit  No.  Do you have questions or concerns about your Care? No.  Actions: * If pain score is 4 or above: No action needed, pain <4.

## 2017-01-17 DIAGNOSIS — F4320 Adjustment disorder, unspecified: Secondary | ICD-10-CM | POA: Diagnosis not present

## 2017-02-20 DIAGNOSIS — F4320 Adjustment disorder, unspecified: Secondary | ICD-10-CM | POA: Diagnosis not present

## 2017-06-11 DIAGNOSIS — L700 Acne vulgaris: Secondary | ICD-10-CM | POA: Diagnosis not present

## 2017-06-11 DIAGNOSIS — M5481 Occipital neuralgia: Secondary | ICD-10-CM | POA: Diagnosis not present

## 2017-06-11 MED FILL — SPIRONOLACTONE 50 MG TABLET: 50 | 60 days supply | Qty: 30 | Fill #0

## 2017-08-13 DIAGNOSIS — Z6824 Body mass index (BMI) 24.0-24.9, adult: Secondary | ICD-10-CM | POA: Diagnosis not present

## 2017-08-13 DIAGNOSIS — Z01419 Encounter for gynecological examination (general) (routine) without abnormal findings: Secondary | ICD-10-CM | POA: Diagnosis not present

## 2017-08-13 DIAGNOSIS — Z1231 Encounter for screening mammogram for malignant neoplasm of breast: Secondary | ICD-10-CM | POA: Diagnosis not present

## 2017-08-14 ENCOUNTER — Other Ambulatory Visit: Payer: Self-pay | Admitting: Obstetrics and Gynecology

## 2017-08-14 DIAGNOSIS — R928 Other abnormal and inconclusive findings on diagnostic imaging of breast: Secondary | ICD-10-CM

## 2017-08-20 ENCOUNTER — Ambulatory Visit
Admission: RE | Admit: 2017-08-20 | Discharge: 2017-08-20 | Disposition: A | Discharge status: Home / Self Care | Payer: 59 | Source: Ambulatory Visit | Attending: Obstetrics and Gynecology | Admitting: Obstetrics and Gynecology

## 2017-08-20 DIAGNOSIS — R928 Other abnormal and inconclusive findings on diagnostic imaging of breast: Secondary | ICD-10-CM

## 2017-08-20 DIAGNOSIS — R922 Inconclusive mammogram: Secondary | ICD-10-CM | POA: Diagnosis not present

## 2017-08-20 DIAGNOSIS — N6001 Solitary cyst of right breast: Secondary | ICD-10-CM | POA: Diagnosis not present

## 2017-08-21 MED FILL — DOXYCYCLINE HYCLATE 100 MG: 100 | 30 days supply | Qty: 60 | Fill #0

## 2017-10-02 DIAGNOSIS — H5213 Myopia, bilateral: Secondary | ICD-10-CM | POA: Diagnosis not present

## 2017-10-17 DIAGNOSIS — Z23 Encounter for immunization: Secondary | ICD-10-CM | POA: Diagnosis not present

## 2017-10-18 DIAGNOSIS — H2511 Age-related nuclear cataract, right eye: Secondary | ICD-10-CM | POA: Diagnosis not present

## 2018-02-08 DIAGNOSIS — F4320 Adjustment disorder, unspecified: Secondary | ICD-10-CM | POA: Diagnosis not present

## 2018-03-04 DIAGNOSIS — J069 Acute upper respiratory infection, unspecified: Secondary | ICD-10-CM | POA: Diagnosis not present

## 2018-03-04 MED FILL — OSELTAMIVIR PHOSPHATE 75 MG: 75 | 5 days supply | Qty: 10 | Fill #0

## 2018-03-05 ENCOUNTER — Ambulatory Visit: Payer: 59 | Admitting: Physician Assistant

## 2018-04-05 ENCOUNTER — Ambulatory Visit: Payer: 59 | Admitting: Physician Assistant

## 2018-06-17 ENCOUNTER — Ambulatory Visit: Payer: 59 | Admitting: Family Medicine

## 2018-06-21 ENCOUNTER — Encounter: Payer: Self-pay | Admitting: Family Medicine

## 2018-06-21 ENCOUNTER — Ambulatory Visit (INDEPENDENT_AMBULATORY_CARE_PROVIDER_SITE_OTHER): Payer: 59 | Admitting: Family Medicine

## 2018-06-21 VITALS — HR 70 | Ht 66.0 in | Wt 150.0 lb

## 2018-06-21 DIAGNOSIS — Z800 Family history of malignant neoplasm of digestive organs: Secondary | ICD-10-CM | POA: Insufficient documentation

## 2018-06-21 DIAGNOSIS — Z905 Acquired absence of kidney: Secondary | ICD-10-CM | POA: Insufficient documentation

## 2018-06-21 DIAGNOSIS — Z975 Presence of (intrauterine) contraceptive device: Secondary | ICD-10-CM | POA: Diagnosis not present

## 2018-06-21 DIAGNOSIS — Z8349 Family history of other endocrine, nutritional and metabolic diseases: Secondary | ICD-10-CM | POA: Diagnosis not present

## 2018-06-21 NOTE — Assessment & Plan Note (Signed)
Follows with GI.  Due for next colonoscopy at age 43.

## 2018-06-21 NOTE — Progress Notes (Signed)
Chief Complaint:  Brianna Alvarez is a 43 y.o. female who presents today for a virtual office visit with a chief complaint of s/p nephrectomy and to establish care.  Assessment/Plan:  S/p nephrectomy Gets metabolic panel checked yearly with OB/GYN.  Will obtain these records.  IUD (intrauterine device) in place Stable.  Continue management per OB/GYN.  Family history of hemochromatosis Stable.  Check CBC yearly.  Family history of colon cancer Follows with GI.  Due for next colonoscopy at age 64.     Subjective:  HPI:  She has no acute complaints today.  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  Underwent nephrectomy in 2001.  Has normal renal function.  # Family history of Colon Cancer -Follows with GI. -Underwent first colonoscopy at age 2.  # Family History of Hemochromatosis -Get CBC checked yearly.  # IUD in place - Follows with OBGYN for yearly pap smears  PMH:  The following were reviewed and entered/updated in epic: Past Medical History:  Diagnosis Date  . Bell's palsy   . Kidney stones   . Lyme disease, unspecified   . Mononucleosis   . Tachycardia    Past Surgical History:  Procedure Laterality Date  . LASIK    . NEPHRECTOMY     2001    Family History  Problem Relation Age of Onset  . Colon polyps Mother   . Hyperthyroidism Mother   . Depression Mother   . Colon cancer Maternal Aunt   . Colon cancer Maternal Uncle   . Cancer Father   . Hemachromatosis Father   . Esophageal cancer Neg Hx   . Stomach cancer Neg Hx   . Rectal cancer Neg Hx     Medications- reviewed and updated Current Outpatient Medications  Medication Sig Dispense Refill  . levonorgestrel (MIRENA) 20 MCG/24HR IUD 1 each by Intrauterine route once.     No current facility-administered medications for this visit.     Allergies-reviewed and updated  Allergies  Allergen Reactions  . Darvocet [Propoxyphene N-Acetaminophen] Nausea And Vomiting  . Dilaudid [Hydromorphone Hcl] Nausea And Vomiting  . Hydromorphone Nausea And Vomiting  . Sulfathiazole Sodium Swelling    Social History   Socioeconomic History  . Marital status: Married    Spouse name: Not on file  . Number of children: Not on file  . Years of education: Not on file  . Highest education level: Not on file  Occupational History  . Not on file  Social Needs  . Financial resource strain: Not on file  . Food insecurity:    Worry: Not on file    Inability: Not on file  . Transportation needs:    Medical: Not on file    Non-medical: Not on file  Tobacco Use  . Smoking status: Never Smoker  . Smokeless tobacco: Never Used  Substance and Sexual Activity  . Alcohol use: Yes    Comment: occas  . Drug use: No  . Sexual activity: Yes  Lifestyle  . Physical activity:    Days per week: Not on file    Minutes per session: Not on file  . Stress: Not on file  Relationships  . Social connections:    Talks on phone: Not on file    Gets together: Not on file    Attends religious service: Not on file    Active member of club or organization: Not on  file    Attends meetings of clubs or organizations: Not on file    Relationship status: Not on file  Other Topics Concern  . Not on file  Social History Narrative  . Not on file        Objective/Observations  Physical Exam: Gen: NAD, resting comfortably Pulm: Normal work of breathing Neuro: Grossly normal, moves all extremities Psych: Normal affect and thought content  Virtual Visit via Video   I connected with Brianna Alvarez on 06/21/18 at  9:40 AM EDT by a video enabled telemedicine application and verified that I am speaking with the correct person using two identifiers. I discussed the limitations of evaluation and management by telemedicine and the availability of in person appointments. The patient  expressed understanding and agreed to proceed.   Patient location: Home Provider location: [place] participating in the virtual visit: Myself and Patient     Time Spent: I spent 20 minutes face-to-face with the patient, with more than half spent coordinating care and on counseling on management plan for her family history of colon cancer, hemachromatosis, and s/p nephrectomy.    Algis Greenhouse. Jerline Pain, MD 06/21/2018 10:44 AM

## 2018-06-21 NOTE — Assessment & Plan Note (Signed)
Stable. Check CBC yearly 

## 2018-06-21 NOTE — Assessment & Plan Note (Signed)
Stable.  Continue management per OB/GYN.

## 2018-06-21 NOTE — Assessment & Plan Note (Signed)
Gets metabolic panel checked yearly with OB/GYN.  Will obtain these records.

## 2018-07-09 DIAGNOSIS — F4320 Adjustment disorder, unspecified: Secondary | ICD-10-CM | POA: Diagnosis not present

## 2018-08-15 ENCOUNTER — Other Ambulatory Visit: Payer: Self-pay | Admitting: Obstetrics and Gynecology

## 2018-08-15 DIAGNOSIS — Z1231 Encounter for screening mammogram for malignant neoplasm of breast: Secondary | ICD-10-CM

## 2018-09-30 ENCOUNTER — Encounter: Payer: Self-pay | Admitting: Family Medicine

## 2018-09-30 ENCOUNTER — Other Ambulatory Visit: Payer: Self-pay

## 2018-09-30 ENCOUNTER — Ambulatory Visit (INDEPENDENT_AMBULATORY_CARE_PROVIDER_SITE_OTHER): Payer: 59 | Admitting: Family Medicine

## 2018-09-30 VITALS — BP 118/72 | HR 81 | Temp 98.2°F | Ht 66.0 in | Wt 153.0 lb

## 2018-09-30 DIAGNOSIS — Z975 Presence of (intrauterine) contraceptive device: Secondary | ICD-10-CM

## 2018-09-30 DIAGNOSIS — Z905 Acquired absence of kidney: Secondary | ICD-10-CM

## 2018-09-30 DIAGNOSIS — Z13220 Encounter for screening for lipoid disorders: Secondary | ICD-10-CM | POA: Diagnosis not present

## 2018-09-30 DIAGNOSIS — Z0001 Encounter for general adult medical examination with abnormal findings: Secondary | ICD-10-CM | POA: Diagnosis not present

## 2018-09-30 DIAGNOSIS — Z8349 Family history of other endocrine, nutritional and metabolic diseases: Secondary | ICD-10-CM

## 2018-09-30 LAB — COMPREHENSIVE METABOLIC PANEL
ALT: 18 U/L (ref 0–35)
AST: 19 U/L (ref 0–37)
Albumin: 4.4 g/dL (ref 3.5–5.2)
Alkaline Phosphatase: 61 U/L (ref 39–117)
BUN: 17 mg/dL (ref 6–23)
CO2: 26 mEq/L (ref 19–32)
Calcium: 9.2 mg/dL (ref 8.4–10.5)
Chloride: 105 mEq/L (ref 96–112)
Creatinine, Ser: 0.88 mg/dL (ref 0.40–1.20)
GFR: 70.55 mL/min (ref >60.00)
Glucose, Bld: 78 mg/dL (ref 70–99)
Potassium: 3.8 mEq/L (ref 3.5–5.1)
Sodium: 139 mEq/L (ref 135–145)
Total Bilirubin: 0.6 mg/dL (ref 0.2–1.2)
Total Protein: 6.9 g/dL (ref 6.0–8.3)

## 2018-09-30 LAB — TSH: TSH: 4.26 u[IU]/mL (ref 0.35–4.50)

## 2018-09-30 LAB — CBC
HCT: 40.4 % (ref 36.0–46.0)
Hemoglobin: 13.7 g/dL (ref 12.0–15.0)
MCHC: 33.8 g/dL (ref 30.0–36.0)
MCV: 95.8 fl (ref 78.0–100.0)
Platelets: 204 10*3/uL (ref 150.0–400.0)
RBC: 4.22 Mil/uL (ref 3.87–5.11)
RDW: 12.3 % (ref 11.5–15.5)
WBC: 6.4 10*3/uL (ref 4.0–10.5)

## 2018-09-30 LAB — LIPID PANEL
Cholesterol: 178 mg/dL (ref 0–200)
HDL: 67.3 mg/dL (ref >39.00)
LDL Cholesterol: 94 mg/dL (ref 0–99)
NonHDL: 110.28
Total CHOL/HDL Ratio: 3
Triglycerides: 80 mg/dL (ref 0.0–149.0)
VLDL: 16 mg/dL (ref 0.0–40.0)

## 2018-09-30 LAB — IBC + FERRITIN
Ferritin: 46.7 ng/mL (ref 10.0–291.0)
Iron: 120 ug/dL (ref 42–145)
Saturation Ratios: 43.3 % (ref 20.0–50.0)
Transferrin: 198 mg/dL — ABNORMAL LOW (ref 212.0–360.0)

## 2018-09-30 NOTE — Patient Instructions (Signed)
It was very nice to see you today!  Keep up the good work! We will check blood work today.  Come back in 1 year for your next physical or sooner if needed.   Take care, Dr Jerline Pain  Please try these tips to maintain a healthy lifestyle:   Eat at least 3 REAL meals and 1-2 snacks per day.  Aim for no more than 5 hours between eating.  If you eat breakfast, please do so within one hour of getting up.    Obtain twice as many fruits/vegetables as protein or carbohydrate foods for both lunch and dinner. (Half of each meal should be fruits/vegetables, one quarter protein, and one quarter starchy carbs)   Cut down on sweet beverages. This includes juice, soda, and sweet tea.    Exercise at least 150 minutes every week.    Preventive Care 81-4 Years Old, Female Preventive care refers to visits with your health care provider and lifestyle choices that can promote health and wellness. This includes:  A yearly physical exam. This may also be called an annual well check.  Regular dental visits and eye exams.  Immunizations.  Screening for certain conditions.  Healthy lifestyle choices, such as eating a healthy diet, getting regular exercise, not using drugs or products that contain nicotine and tobacco, and limiting alcohol use. What can I expect for my preventive care visit? Physical exam Your health care provider will check your:  Height and weight. This may be used to calculate body mass index (BMI), which tells if you are at a healthy weight.  Heart rate and blood pressure.  Skin for abnormal spots. Counseling Your health care provider may ask you questions about your:  Alcohol, tobacco, and drug use.  Emotional well-being.  Home and relationship well-being.  Sexual activity.  Eating habits.  Work and work Statistician.  Method of birth control.  Menstrual cycle.  Pregnancy history. What immunizations do I need?  Influenza (flu) vaccine  This is recommended  every year. Tetanus, diphtheria, and pertussis (Tdap) vaccine  You may need a Td booster every 10 years. Varicella (chickenpox) vaccine  You may need this if you have not been vaccinated. Zoster (shingles) vaccine  You may need this after age 67. Measles, mumps, and rubella (MMR) vaccine  You may need at least one dose of MMR if you were born in 1957 or later. You may also need a second dose. Pneumococcal conjugate (PCV13) vaccine  You may need this if you have certain conditions and were not previously vaccinated. Pneumococcal polysaccharide (PPSV23) vaccine  You may need one or two doses if you smoke cigarettes or if you have certain conditions. Meningococcal conjugate (MenACWY) vaccine  You may need this if you have certain conditions. Hepatitis A vaccine  You may need this if you have certain conditions or if you travel or work in places where you may be exposed to hepatitis A. Hepatitis B vaccine  You may need this if you have certain conditions or if you travel or work in places where you may be exposed to hepatitis B. Haemophilus influenzae type b (Hib) vaccine  You may need this if you have certain conditions. Human papillomavirus (HPV) vaccine  If recommended by your health care provider, you may need three doses over 6 months. You may receive vaccines as individual doses or as more than one vaccine together in one shot (combination vaccines). Talk with your health care provider about the risks and benefits of combination vaccines. What tests  do I need? Blood tests  Lipid and cholesterol levels. These may be checked every 5 years, or more frequently if you are over 63 years old.  Hepatitis C test.  Hepatitis B test. Screening  Lung cancer screening. You may have this screening every year starting at age 61 if you have a 30-pack-year history of smoking and currently smoke or have quit within the past 15 years.  Colorectal cancer screening. All adults should have  this screening starting at age 27 and continuing until age 57. Your health care provider may recommend screening at age 62 if you are at increased risk. You will have tests every 1-10 years, depending on your results and the type of screening test.  Diabetes screening. This is done by checking your blood sugar (glucose) after you have not eaten for a while (fasting). You may have this done every 1-3 years.  Mammogram. This may be done every 1-2 years. Talk with your health care provider about when you should start having regular mammograms. This may depend on whether you have a family history of breast cancer.  BRCA-related cancer screening. This may be done if you have a family history of breast, ovarian, tubal, or peritoneal cancers.  Pelvic exam and Pap test. This may be done every 3 years starting at age 51. Starting at age 12, this may be done every 5 years if you have a Pap test in combination with an HPV test. Other tests  Sexually transmitted disease (STD) testing.  Bone density scan. This is done to screen for osteoporosis. You may have this scan if you are at high risk for osteoporosis. Follow these instructions at home: Eating and drinking  Eat a diet that includes fresh fruits and vegetables, whole grains, lean protein, and low-fat dairy.  Take vitamin and mineral supplements as recommended by your health care provider.  Do not drink alcohol if: ? Your health care provider tells you not to drink. ? You are pregnant, may be pregnant, or are planning to become pregnant.  If you drink alcohol: ? Limit how much you have to 0-1 drink a day. ? Be aware of how much alcohol is in your drink. In the [place] one drink equals one 12 oz bottle of beer (355 mL), one 5 oz glass of wine (148 mL), or one 1 oz glass of hard liquor (44 mL). Lifestyle  Take daily care of your teeth and gums.  Stay active. Exercise for at least 30 minutes on 5 or more days each week.  Do not use any products  that contain nicotine or tobacco, such as cigarettes, e-cigarettes, and chewing tobacco. If you need help quitting, ask your health care provider.  If you are sexually active, practice safe sex. Use a condom or other form of birth control (contraception) in order to prevent pregnancy and STIs (sexually transmitted infections).  If told by your health care provider, take low-dose aspirin daily starting at age 67. What's next?  Visit your health care provider once a year for a well check visit.  Ask your health care provider how often you should have your eyes and teeth checked.  Stay up to date on all vaccines. This information is not intended to replace advice given to you by your health care provider. Make sure you discuss any questions you have with your health care provider. Document Released: 02/12/2015 Document Revised: 09/27/2017 Document Reviewed: 09/27/2017 Elsevier Patient Education  2020 [employer].

## 2018-09-30 NOTE — Progress Notes (Signed)
Chief Complaint:  Brianna Alvarez is a 43 y.o. female who presents today for her annual comprehensive physical exam.    Assessment/Plan:  IUD (intrauterine device) in place Continue management per OB/GYN.  Family history of hemochromatosis Check CBC and iron panel.  S/p nephrectomy Check metabolic panel.   Preventative Healthcare: Flu vaccine later this year. Gets pap and mammogram via OBGYN.  Check CBC, C met, TSH, and lipid panel.  Patient Counseling(The following topics were reviewed and/or handout was given):  -Nutrition: Stressed importance of moderation in sodium/caffeine intake, saturated fat and cholesterol, caloric balance, sufficient intake of fresh fruits, vegetables, and fiber.  -Stressed the importance of regular exercise.   -Substance Abuse: Discussed cessation/primary prevention of tobacco, alcohol, or other drug use; driving or other dangerous activities under the influence; availability of treatment for abuse.   -Injury prevention: Discussed safety belts, safety helmets, smoke detector, smoking near bedding or upholstery.   -Sexuality: Discussed sexually transmitted diseases, partner selection, use of condoms, avoidance of unintended pregnancy and contraceptive alternatives.   -Dental health: Discussed importance of regular tooth brushing, flossing, and dental visits.  -Health maintenance and immunizations reviewed. Please refer to Health maintenance section.  Return to care in 1 year for next preventative visit.     Subjective:  HPI:  She has no acute complaints today.   Lifestyle Diet: No specific diets or eating plans. Tries to eat a healthy and balanced diet with plenty of fruits and vegetables.  Exercise: Works about 6 days per week.  Likes to run.   Depression screen PHQ 2/9 06/21/2018  Decreased Interest 0  Down, Depressed, Hopeless 0  PHQ - 2 Score 0    There are no preventive care reminders to display for this patient.   ROS: Per HPI,  otherwise a complete review of systems was negative.   PMH:  The following were reviewed and entered/updated in epic: Past Medical History:  Diagnosis Date  . Bell's palsy   . Kidney stones   . Lyme disease, unspecified   . Mononucleosis   . Tachycardia    Patient Active Problem List   Diagnosis Date Noted  . S/p nephrectomy 06/21/2018  . Family history of colon cancer 06/21/2018  . Family history of hemochromatosis 06/21/2018  . IUD (intrauterine device) in place 06/21/2018   Past Surgical History:  Procedure Laterality Date  . LASIK    . NEPHRECTOMY     2001    Family History  Problem Relation Age of Onset  . Colon polyps Mother   . Hyperthyroidism Mother   . Depression Mother   . Colon cancer Maternal Aunt   . Colon cancer Maternal Uncle   . Cancer Father   . Hemachromatosis Father   . Esophageal cancer Neg Hx   . Stomach cancer Neg Hx   . Rectal cancer Neg Hx     Medications- reviewed and updated Current Outpatient Medications  Medication Sig Dispense Refill  . levonorgestrel (MIRENA) 20 MCG/24HR IUD 1 each by Intrauterine route once.     No current facility-administered medications for this visit.     Allergies-reviewed and updated Allergies  Allergen Reactions  . Darvocet [Propoxyphene N-Acetaminophen] Nausea And Vomiting  . Dilaudid [Hydromorphone Hcl] Nausea And Vomiting  . Hydromorphone Nausea And Vomiting  . Sulfathiazole Sodium Swelling    Social History   Socioeconomic History  . Marital status: Married    Spouse name: Not on file  . Number of children: Not on file  . Years  of education: Not on file  . Highest education level: Not on file  Occupational History  . Not on file  Social Needs  . Financial resource strain: Not on file  . Food insecurity    Worry: Not on file    Inability: Not on file  . Transportation needs    Medical: Not on file    Non-medical: Not on file  Tobacco Use  . Smoking status: Never Smoker  . Smokeless  tobacco: Never Used  Substance and Sexual Activity  . Alcohol use: Yes    Comment: occas  . Drug use: No  . Sexual activity: Yes  Lifestyle  . Physical activity    Days per week: Not on file    Minutes per session: Not on file  . Stress: Not on file  Relationships  . Social Herbalist on phone: Not on file    Gets together: Not on file    Attends religious service: Not on file    Active member of club or organization: Not on file    Attends meetings of clubs or organizations: Not on file    Relationship status: Not on file  Other Topics Concern  . Not on file  Social History Narrative  . Not on file        Objective:  Physical Exam: BP 118/72 (BP Location: Left Arm, Patient Position: Sitting, Cuff Size: Normal)   Pulse 81   Temp 98.2 F (36.8 C) (Oral)   Ht '5\' 6"'$  (1.676 m)   Wt 153 lb (69.4 kg)   SpO2 99%   BMI 24.69 kg/m   Body mass index is 24.69 kg/m. Wt Readings from Last 3 Encounters:  09/30/18 153 lb (69.4 kg)  06/21/18 150 lb (68 kg)  01/10/17 149 lb (67.6 kg)  Gen: NAD, resting comfortably HEENT: TMs normal bilaterally. OP clear. No thyromegaly noted.  CV: RRR with no murmurs appreciated Pulm: NWOB, CTAB with no crackles, wheezes, or rhonchi GI: Normal bowel sounds present. Soft, Nontender, Nondistended. MSK: no edema, cyanosis, or clubbing noted Skin: warm, dry Neuro: CN2-12 grossly intact. Strength 5/5 in upper and lower extremities. Reflexes symmetric and intact bilaterally.  Psych: Normal affect and thought content      M. Jerline Pain, MD 09/30/2018 8:29 AM

## 2018-09-30 NOTE — Assessment & Plan Note (Signed)
Check metabolic panel.  

## 2018-09-30 NOTE — Assessment & Plan Note (Signed)
Check CBC and iron panel.

## 2018-09-30 NOTE — Progress Notes (Signed)
Please inform patient of the following:  Blood work is all NORMAL. Would like for her to keep up the good work and we can recheck in a year.  Algis Greenhouse. Jerline Pain, MD 09/30/2018 4:12 PM

## 2018-09-30 NOTE — Assessment & Plan Note (Signed)
Continue management per OB/GYN.

## 2018-10-01 ENCOUNTER — Ambulatory Visit
Admission: RE | Admit: 2018-10-01 | Discharge: 2018-10-01 | Disposition: A | Discharge status: Home / Self Care | Payer: 59 | Source: Ambulatory Visit | Attending: Obstetrics and Gynecology | Admitting: Obstetrics and Gynecology

## 2018-10-01 DIAGNOSIS — Z1231 Encounter for screening mammogram for malignant neoplasm of breast: Secondary | ICD-10-CM

## 2018-10-02 ENCOUNTER — Ambulatory Visit: Payer: Self-pay | Admitting: *Deleted

## 2018-10-02 NOTE — Telephone Encounter (Signed)
Pt given results per Dr Dimas Chyle, "Blood work is all NORMAL. Would like for her to keep up the good work and we can recheck in a year."; she verbalized understanding; also see result note dated 10/02/2018.   Reason for Disposition . Health Information question, no triage required and triager able to answer question  Answer Assessment - Initial Assessment Questions 1. REASON FOR CALL or QUESTION: "What is your reason for calling today?" or "How can I best help you?" or "What question do you have that I can help answer?"     Pt returning call to office for lab results  Protocols used: INFORMATION ONLY CALL-A-AH

## 2018-10-02 NOTE — Telephone Encounter (Signed)
Noted  

## 2018-10-08 DIAGNOSIS — L814 Other melanin hyperpigmentation: Secondary | ICD-10-CM | POA: Diagnosis not present

## 2018-10-08 DIAGNOSIS — D234 Other benign neoplasm of skin of scalp and neck: Secondary | ICD-10-CM | POA: Diagnosis not present

## 2018-10-08 DIAGNOSIS — Z86018 Personal history of other benign neoplasm: Secondary | ICD-10-CM | POA: Diagnosis not present

## 2018-10-08 DIAGNOSIS — L820 Inflamed seborrheic keratosis: Secondary | ICD-10-CM | POA: Diagnosis not present

## 2018-10-08 DIAGNOSIS — D225 Melanocytic nevi of trunk: Secondary | ICD-10-CM | POA: Diagnosis not present

## 2018-10-29 DIAGNOSIS — Z23 Encounter for immunization: Secondary | ICD-10-CM | POA: Diagnosis not present

## 2018-11-08 DIAGNOSIS — Z6825 Body mass index (BMI) 25.0-25.9, adult: Secondary | ICD-10-CM | POA: Diagnosis not present

## 2018-11-08 DIAGNOSIS — Z01419 Encounter for gynecological examination (general) (routine) without abnormal findings: Secondary | ICD-10-CM | POA: Diagnosis not present

## 2018-11-08 DIAGNOSIS — Z30431 Encounter for routine checking of intrauterine contraceptive device: Secondary | ICD-10-CM | POA: Diagnosis not present

## 2019-09-01 DIAGNOSIS — F419 Anxiety disorder, unspecified: Secondary | ICD-10-CM | POA: Diagnosis not present

## 2019-09-22 ENCOUNTER — Other Ambulatory Visit: Payer: Self-pay | Admitting: Obstetrics and Gynecology

## 2019-09-22 DIAGNOSIS — Z1231 Encounter for screening mammogram for malignant neoplasm of breast: Secondary | ICD-10-CM

## 2019-10-06 DIAGNOSIS — F4320 Adjustment disorder, unspecified: Secondary | ICD-10-CM | POA: Diagnosis not present

## 2019-10-21 ENCOUNTER — Ambulatory Visit: Payer: 59

## 2019-10-21 DIAGNOSIS — L710 Perioral dermatitis: Secondary | ICD-10-CM | POA: Diagnosis not present

## 2019-10-21 DIAGNOSIS — L578 Other skin changes due to chronic exposure to nonionizing radiation: Secondary | ICD-10-CM | POA: Diagnosis not present

## 2019-10-21 DIAGNOSIS — Z86018 Personal history of other benign neoplasm: Secondary | ICD-10-CM | POA: Diagnosis not present

## 2019-10-21 DIAGNOSIS — D225 Melanocytic nevi of trunk: Secondary | ICD-10-CM | POA: Diagnosis not present

## 2019-10-21 DIAGNOSIS — L814 Other melanin hyperpigmentation: Secondary | ICD-10-CM | POA: Diagnosis not present

## 2019-10-21 DIAGNOSIS — D234 Other benign neoplasm of skin of scalp and neck: Secondary | ICD-10-CM | POA: Diagnosis not present

## 2019-10-24 DIAGNOSIS — Z23 Encounter for immunization: Secondary | ICD-10-CM | POA: Diagnosis not present

## 2019-12-05 DIAGNOSIS — Z30431 Encounter for routine checking of intrauterine contraceptive device: Secondary | ICD-10-CM | POA: Diagnosis not present

## 2019-12-05 DIAGNOSIS — Z01419 Encounter for gynecological examination (general) (routine) without abnormal findings: Secondary | ICD-10-CM | POA: Diagnosis not present

## 2019-12-05 DIAGNOSIS — Z803 Family history of malignant neoplasm of breast: Secondary | ICD-10-CM | POA: Diagnosis not present

## 2019-12-05 DIAGNOSIS — N8189 Other female genital prolapse: Secondary | ICD-10-CM | POA: Diagnosis not present

## 2019-12-05 DIAGNOSIS — Z6824 Body mass index (BMI) 24.0-24.9, adult: Secondary | ICD-10-CM | POA: Diagnosis not present

## 2019-12-11 DIAGNOSIS — F4323 Adjustment disorder with mixed anxiety and depressed mood: Secondary | ICD-10-CM | POA: Diagnosis not present

## 2019-12-23 DIAGNOSIS — F4323 Adjustment disorder with mixed anxiety and depressed mood: Secondary | ICD-10-CM | POA: Diagnosis not present

## 2020-01-06 DIAGNOSIS — F4323 Adjustment disorder with mixed anxiety and depressed mood: Secondary | ICD-10-CM | POA: Diagnosis not present

## 2020-01-19 DIAGNOSIS — F4323 Adjustment disorder with mixed anxiety and depressed mood: Secondary | ICD-10-CM | POA: Diagnosis not present

## 2020-02-17 ENCOUNTER — Other Ambulatory Visit (HOSPITAL_COMMUNITY): Payer: Self-pay | Admitting: Family Medicine

## 2020-03-05 DIAGNOSIS — F4323 Adjustment disorder with mixed anxiety and depressed mood: Secondary | ICD-10-CM | POA: Diagnosis not present

## 2020-03-12 DIAGNOSIS — F4323 Adjustment disorder with mixed anxiety and depressed mood: Secondary | ICD-10-CM | POA: Diagnosis not present

## 2020-03-19 DIAGNOSIS — F411 Generalized anxiety disorder: Secondary | ICD-10-CM | POA: Diagnosis not present

## 2020-03-21 DIAGNOSIS — F4323 Adjustment disorder with mixed anxiety and depressed mood: Secondary | ICD-10-CM | POA: Diagnosis not present

## 2020-04-25 DIAGNOSIS — F4323 Adjustment disorder with mixed anxiety and depressed mood: Secondary | ICD-10-CM | POA: Diagnosis not present

## 2020-05-03 DIAGNOSIS — F4323 Adjustment disorder with mixed anxiety and depressed mood: Secondary | ICD-10-CM | POA: Diagnosis not present

## 2020-05-10 DIAGNOSIS — F4323 Adjustment disorder with mixed anxiety and depressed mood: Secondary | ICD-10-CM | POA: Diagnosis not present

## 2020-05-26 ENCOUNTER — Other Ambulatory Visit (HOSPITAL_COMMUNITY): Payer: Self-pay

## 2020-05-26 MED FILL — Sertraline HCl Tab 100 MG: ORAL | 30 days supply | Qty: 30 | Fill #0 | Status: AC

## 2020-05-27 DIAGNOSIS — F4323 Adjustment disorder with mixed anxiety and depressed mood: Secondary | ICD-10-CM | POA: Diagnosis not present

## 2020-05-30 DIAGNOSIS — F4323 Adjustment disorder with mixed anxiety and depressed mood: Secondary | ICD-10-CM | POA: Diagnosis not present

## 2020-06-20 DIAGNOSIS — F4323 Adjustment disorder with mixed anxiety and depressed mood: Secondary | ICD-10-CM | POA: Diagnosis not present

## 2020-06-24 DIAGNOSIS — F4323 Adjustment disorder with mixed anxiety and depressed mood: Secondary | ICD-10-CM | POA: Diagnosis not present

## 2020-07-08 ENCOUNTER — Other Ambulatory Visit: Payer: Self-pay | Admitting: Family Medicine

## 2020-07-08 ENCOUNTER — Other Ambulatory Visit (HOSPITAL_COMMUNITY): Payer: Self-pay

## 2020-07-08 MED ORDER — SERTRALINE HCL 100 MG PO TABS
1.0000 | ORAL_TABLET | Freq: Every day | ORAL | 1 refills | Status: DC
  Filled 2020-07-08: qty 30, 30d supply, fill #0
  Filled 2020-09-09: qty 30, 30d supply, fill #1

## 2020-07-21 ENCOUNTER — Other Ambulatory Visit: Payer: Self-pay | Admitting: Obstetrics and Gynecology

## 2020-07-21 ENCOUNTER — Other Ambulatory Visit: Payer: Self-pay

## 2020-07-21 DIAGNOSIS — Z1231 Encounter for screening mammogram for malignant neoplasm of breast: Secondary | ICD-10-CM

## 2020-08-05 DIAGNOSIS — F411 Generalized anxiety disorder: Secondary | ICD-10-CM | POA: Diagnosis not present

## 2020-08-05 DIAGNOSIS — F3342 Major depressive disorder, recurrent, in full remission: Secondary | ICD-10-CM | POA: Diagnosis not present

## 2020-08-10 ENCOUNTER — Other Ambulatory Visit: Payer: Self-pay | Admitting: Student

## 2020-08-10 DIAGNOSIS — D329 Benign neoplasm of meninges, unspecified: Secondary | ICD-10-CM

## 2020-09-03 IMAGING — MG MM DIGITAL SCREENING BILAT W/ TOMO W/ CAD
8 series · 8 of 24 positions shown · non-contrast
Comparison: Previous exam(s).

CLINICAL DATA: Screening.

EXAM:
DIGITAL SCREENING BILATERAL MAMMOGRAM WITH TOMO AND CAD

[L MLO synth-2D]
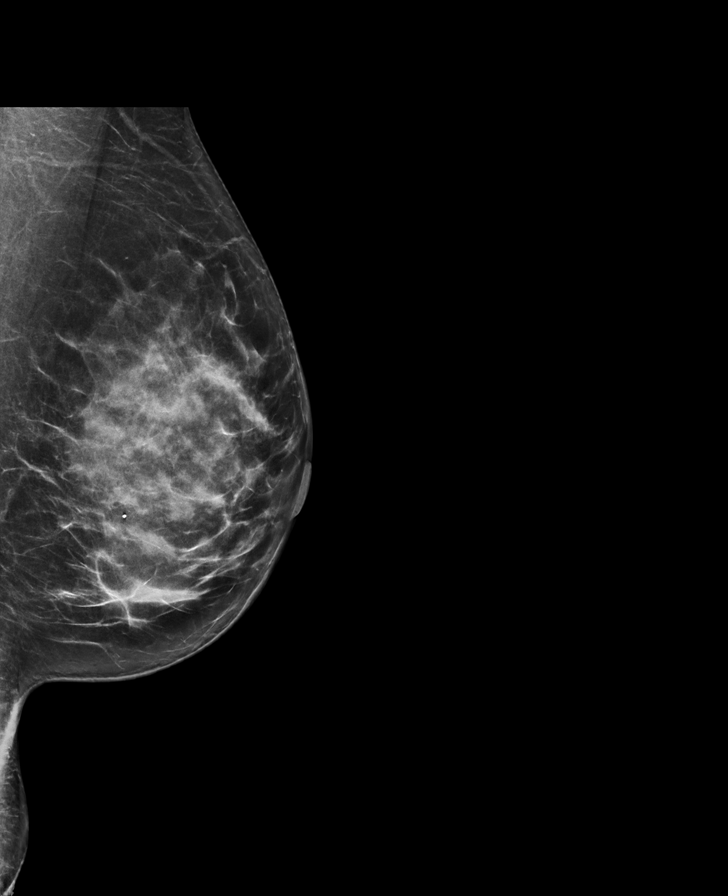

[R CC synth-2D]
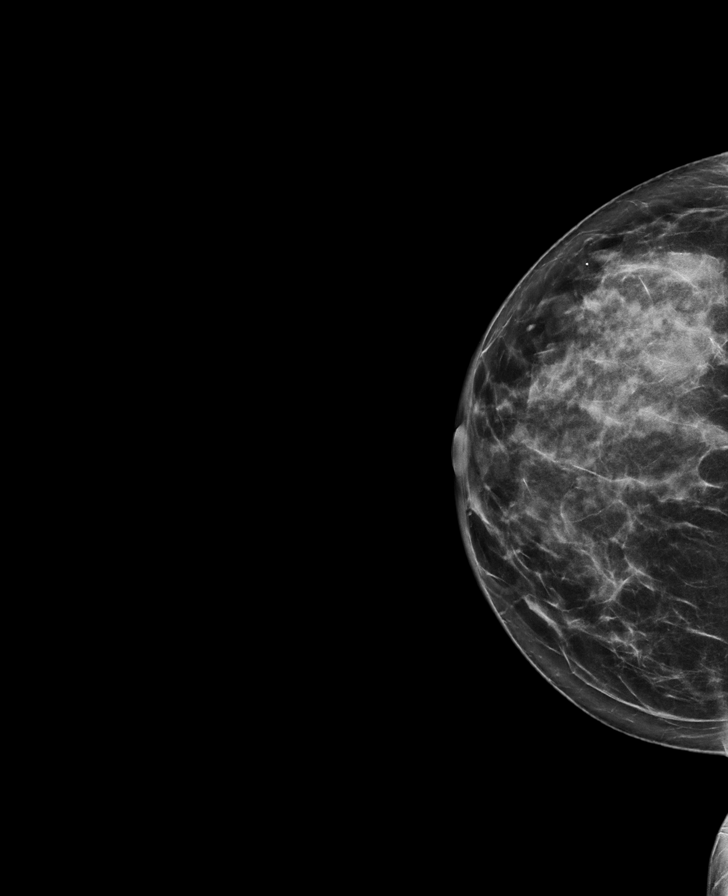

[R MLO synth-2D]
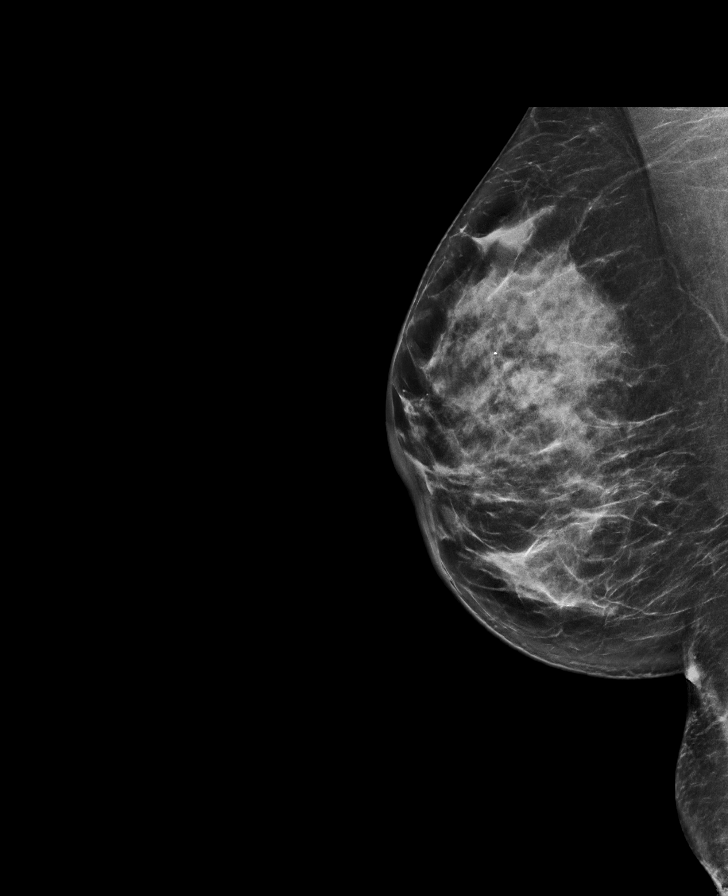

[L CC synth-2D]
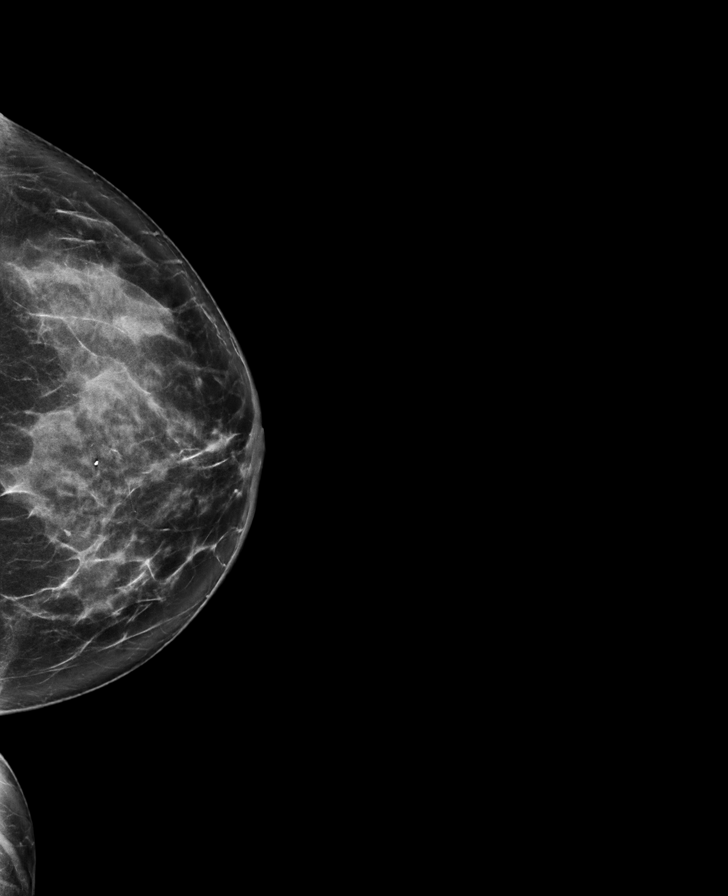

[L CC tomo · tomo slice 37/74.0]
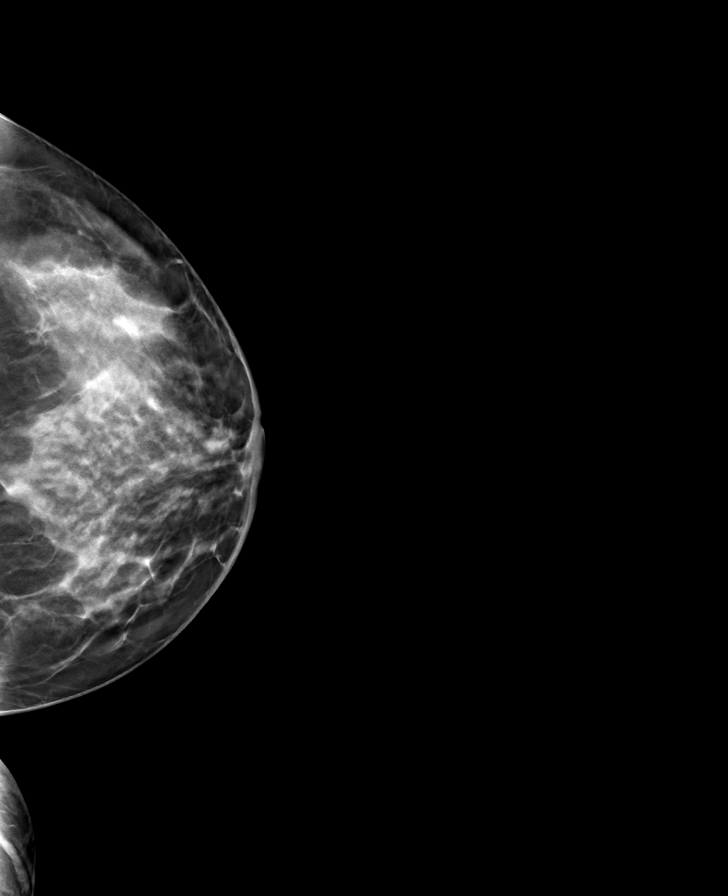

[R CC tomo · tomo slice 36/71.0]
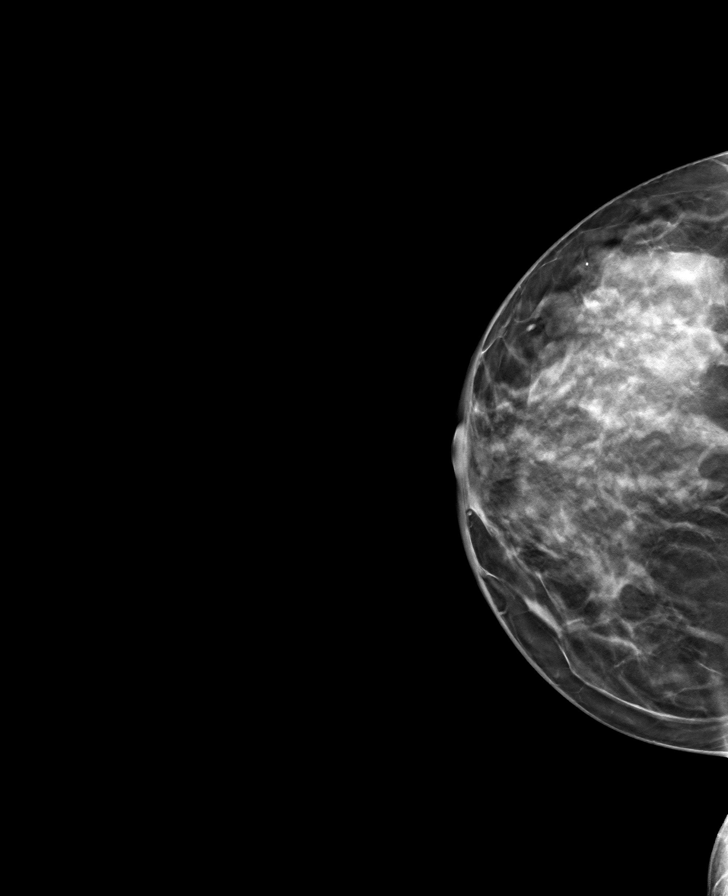

[L MLO tomo · tomo slice 37/72.0]
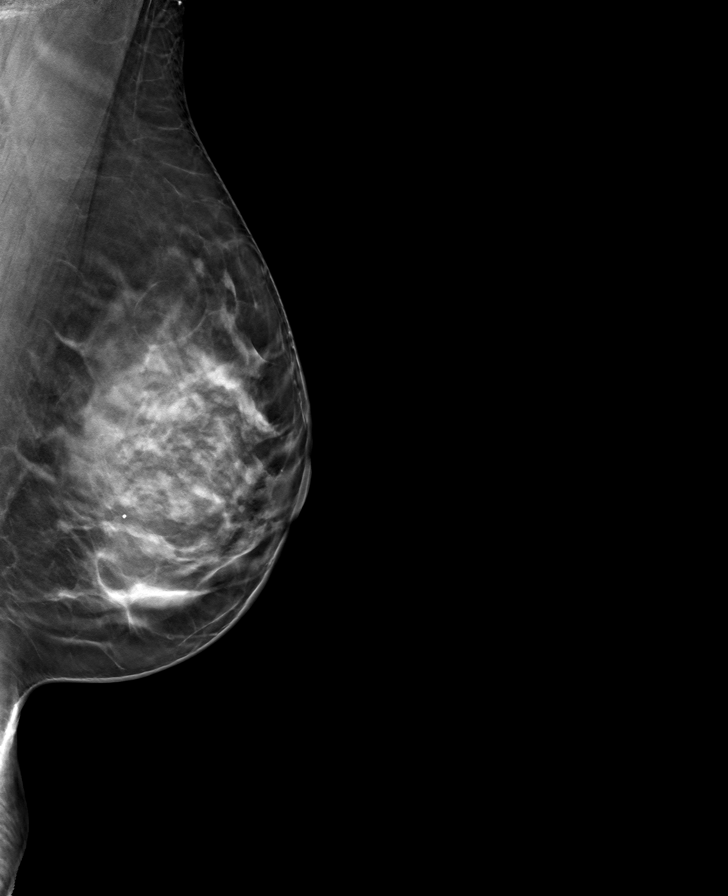

[R MLO tomo · tomo slice 36/71.0]
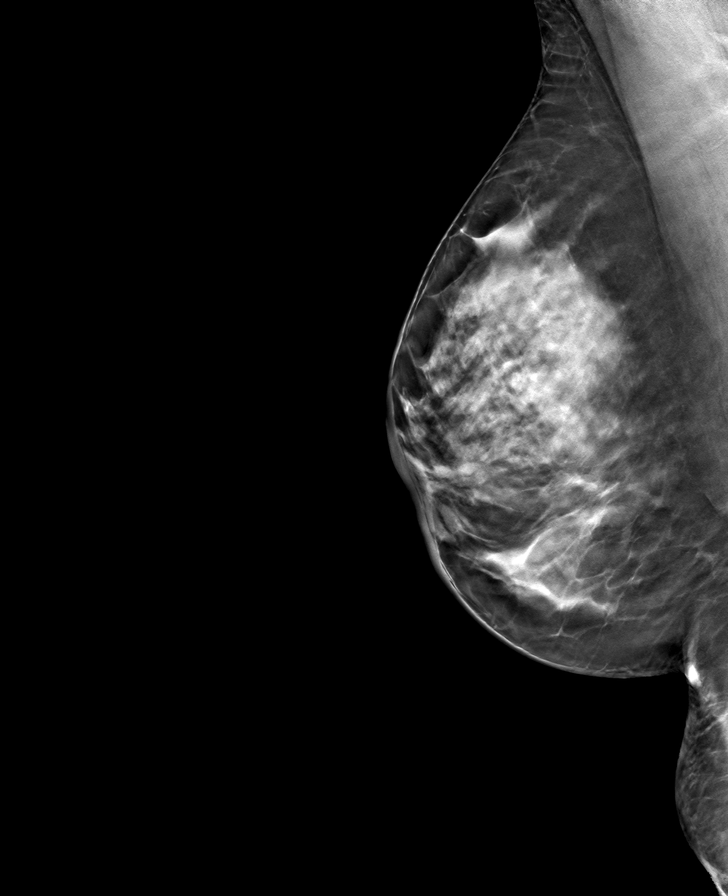

[8 of 24 positions shown; findings below may reference images not displayed]

ACR Breast Density Category c: The breast tissue is heterogeneously
dense, which may obscure small masses.
FINDINGS: There are no findings suspicious for malignancy. Images were
processed with CAD.
IMPRESSION: No mammographic evidence of malignancy. A result letter of this
screening mammogram will be mailed directly to the patient.

RECOMMENDATION:
Screening mammogram in one year. (Code:FT-U-LHB)

BI-RADS CATEGORY  1: Negative.

## 2020-09-06 ENCOUNTER — Ambulatory Visit (INDEPENDENT_AMBULATORY_CARE_PROVIDER_SITE_OTHER): Payer: 59

## 2020-09-06 ENCOUNTER — Other Ambulatory Visit: Payer: Self-pay

## 2020-09-06 DIAGNOSIS — D329 Benign neoplasm of meninges, unspecified: Secondary | ICD-10-CM

## 2020-09-06 DIAGNOSIS — G9389 Other specified disorders of brain: Secondary | ICD-10-CM | POA: Diagnosis not present

## 2020-09-06 MED ORDER — GADOBUTROL 1 MMOL/ML IV SOLN
7.0000 mL | Freq: Once | INTRAVENOUS | Status: AC | PRN
  Administered 2020-09-06: 7.5 mL via INTRAVENOUS

## 2020-09-09 ENCOUNTER — Other Ambulatory Visit (HOSPITAL_COMMUNITY): Payer: Self-pay

## 2020-09-15 DIAGNOSIS — F4323 Adjustment disorder with mixed anxiety and depressed mood: Secondary | ICD-10-CM | POA: Diagnosis not present

## 2020-09-23 ENCOUNTER — Other Ambulatory Visit: Payer: Self-pay

## 2020-09-23 ENCOUNTER — Other Ambulatory Visit (HOSPITAL_COMMUNITY): Payer: Self-pay

## 2020-09-23 ENCOUNTER — Ambulatory Visit
Admission: RE | Admit: 2020-09-23 | Discharge: 2020-09-23 | Disposition: A | Discharge status: Home / Self Care | Payer: 59 | Source: Ambulatory Visit | Attending: Obstetrics and Gynecology | Admitting: Obstetrics and Gynecology

## 2020-09-23 DIAGNOSIS — Z30433 Encounter for removal and reinsertion of intrauterine contraceptive device: Secondary | ICD-10-CM | POA: Diagnosis not present

## 2020-09-23 DIAGNOSIS — Z1231 Encounter for screening mammogram for malignant neoplasm of breast: Secondary | ICD-10-CM | POA: Diagnosis not present

## 2020-09-23 DIAGNOSIS — E663 Overweight: Secondary | ICD-10-CM | POA: Diagnosis not present

## 2020-09-23 MED ORDER — PHENTERMINE HCL 37.5 MG PO TABS
37.5000 mg | ORAL_TABLET | Freq: Every morning | ORAL | 1 refills | Status: DC
  Filled 2020-09-23 – 2020-10-13 (×2): qty 30, 30d supply, fill #0
  Filled 2021-01-20: qty 30, 30d supply, fill #1

## 2020-09-27 ENCOUNTER — Other Ambulatory Visit: Payer: Self-pay | Admitting: Obstetrics and Gynecology

## 2020-09-27 DIAGNOSIS — R928 Other abnormal and inconclusive findings on diagnostic imaging of breast: Secondary | ICD-10-CM

## 2020-10-01 ENCOUNTER — Ambulatory Visit
Admission: RE | Admit: 2020-10-01 | Discharge: 2020-10-01 | Disposition: A | Discharge status: Home / Self Care | Payer: 59 | Source: Ambulatory Visit | Attending: Obstetrics and Gynecology | Admitting: Obstetrics and Gynecology

## 2020-10-01 ENCOUNTER — Other Ambulatory Visit: Payer: Self-pay

## 2020-10-01 ENCOUNTER — Other Ambulatory Visit (HOSPITAL_COMMUNITY): Payer: Self-pay

## 2020-10-01 DIAGNOSIS — R928 Other abnormal and inconclusive findings on diagnostic imaging of breast: Secondary | ICD-10-CM

## 2020-10-01 DIAGNOSIS — R922 Inconclusive mammogram: Secondary | ICD-10-CM | POA: Diagnosis not present

## 2020-10-01 DIAGNOSIS — N6001 Solitary cyst of right breast: Secondary | ICD-10-CM | POA: Diagnosis not present

## 2020-10-09 DIAGNOSIS — Z20822 Contact with and (suspected) exposure to covid-19: Secondary | ICD-10-CM | POA: Diagnosis not present

## 2020-10-13 ENCOUNTER — Other Ambulatory Visit (HOSPITAL_BASED_OUTPATIENT_CLINIC_OR_DEPARTMENT_OTHER): Payer: Self-pay

## 2020-10-13 ENCOUNTER — Other Ambulatory Visit: Payer: 59

## 2020-10-13 ENCOUNTER — Other Ambulatory Visit (HOSPITAL_COMMUNITY): Payer: Self-pay

## 2020-10-28 DIAGNOSIS — F4323 Adjustment disorder with mixed anxiety and depressed mood: Secondary | ICD-10-CM | POA: Diagnosis not present

## 2020-11-01 ENCOUNTER — Other Ambulatory Visit (HOSPITAL_COMMUNITY): Payer: Self-pay

## 2020-11-01 MED ORDER — ALPRAZOLAM 0.25 MG PO TABS
ORAL_TABLET | ORAL | 0 refills | Status: DC
  Filled 2020-11-01 – 2021-01-20 (×2): qty 60, 20d supply, fill #0

## 2020-11-01 MED ORDER — SERTRALINE HCL 100 MG PO TABS
ORAL_TABLET | ORAL | 0 refills | Status: DC
  Filled 2020-11-01 – 2021-01-20 (×2): qty 90, 90d supply, fill #0

## 2020-11-05 DIAGNOSIS — F4323 Adjustment disorder with mixed anxiety and depressed mood: Secondary | ICD-10-CM | POA: Diagnosis not present

## 2020-11-09 ENCOUNTER — Other Ambulatory Visit (HOSPITAL_COMMUNITY): Payer: Self-pay

## 2020-11-12 DIAGNOSIS — F4323 Adjustment disorder with mixed anxiety and depressed mood: Secondary | ICD-10-CM | POA: Diagnosis not present

## 2020-11-18 DIAGNOSIS — F4323 Adjustment disorder with mixed anxiety and depressed mood: Secondary | ICD-10-CM | POA: Diagnosis not present

## 2020-11-21 DIAGNOSIS — F4323 Adjustment disorder with mixed anxiety and depressed mood: Secondary | ICD-10-CM | POA: Diagnosis not present

## 2020-11-28 DIAGNOSIS — F4323 Adjustment disorder with mixed anxiety and depressed mood: Secondary | ICD-10-CM | POA: Diagnosis not present

## 2020-12-05 DIAGNOSIS — F4323 Adjustment disorder with mixed anxiety and depressed mood: Secondary | ICD-10-CM | POA: Diagnosis not present

## 2020-12-09 DIAGNOSIS — F4323 Adjustment disorder with mixed anxiety and depressed mood: Secondary | ICD-10-CM | POA: Diagnosis not present

## 2020-12-26 DIAGNOSIS — F4323 Adjustment disorder with mixed anxiety and depressed mood: Secondary | ICD-10-CM | POA: Diagnosis not present

## 2021-01-20 ENCOUNTER — Other Ambulatory Visit (HOSPITAL_COMMUNITY): Payer: Self-pay

## 2021-03-03 DIAGNOSIS — F4323 Adjustment disorder with mixed anxiety and depressed mood: Secondary | ICD-10-CM | POA: Diagnosis not present

## 2021-03-24 DIAGNOSIS — F4323 Adjustment disorder with mixed anxiety and depressed mood: Secondary | ICD-10-CM | POA: Diagnosis not present

## 2021-04-21 ENCOUNTER — Other Ambulatory Visit (HOSPITAL_BASED_OUTPATIENT_CLINIC_OR_DEPARTMENT_OTHER): Payer: Self-pay

## 2021-04-21 DIAGNOSIS — F411 Generalized anxiety disorder: Secondary | ICD-10-CM | POA: Diagnosis not present

## 2021-04-21 DIAGNOSIS — F900 Attention-deficit hyperactivity disorder, predominantly inattentive type: Secondary | ICD-10-CM | POA: Diagnosis not present

## 2021-04-21 MED ORDER — AMPHETAMINE-DEXTROAMPHETAMINE 10 MG PO TABS
ORAL_TABLET | ORAL | 0 refills | Status: DC
  Filled 2021-04-21: qty 60, 30d supply, fill #0

## 2021-04-25 ENCOUNTER — Other Ambulatory Visit (HOSPITAL_BASED_OUTPATIENT_CLINIC_OR_DEPARTMENT_OTHER): Payer: Self-pay

## 2021-04-25 MED ORDER — METHYLPHENIDATE HCL 10 MG PO TABS
ORAL_TABLET | ORAL | 0 refills | Status: DC
  Filled 2021-04-25: qty 60, 30d supply, fill #0

## 2021-05-05 DIAGNOSIS — Z124 Encounter for screening for malignant neoplasm of cervix: Secondary | ICD-10-CM | POA: Diagnosis not present

## 2021-05-05 DIAGNOSIS — Z6827 Body mass index (BMI) 27.0-27.9, adult: Secondary | ICD-10-CM | POA: Diagnosis not present

## 2021-05-05 DIAGNOSIS — Z01419 Encounter for gynecological examination (general) (routine) without abnormal findings: Secondary | ICD-10-CM | POA: Diagnosis not present

## 2021-05-06 ENCOUNTER — Other Ambulatory Visit (HOSPITAL_BASED_OUTPATIENT_CLINIC_OR_DEPARTMENT_OTHER): Payer: Self-pay

## 2021-05-06 MED ORDER — PHENTERMINE HCL 37.5 MG PO TABS
37.5000 mg | ORAL_TABLET | Freq: Every morning | ORAL | 2 refills | Status: DC
  Filled 2021-05-06: qty 30, 30d supply, fill #0

## 2021-05-26 ENCOUNTER — Other Ambulatory Visit (HOSPITAL_BASED_OUTPATIENT_CLINIC_OR_DEPARTMENT_OTHER): Payer: Self-pay

## 2021-05-26 DIAGNOSIS — F900 Attention-deficit hyperactivity disorder, predominantly inattentive type: Secondary | ICD-10-CM | POA: Diagnosis not present

## 2021-05-26 DIAGNOSIS — F411 Generalized anxiety disorder: Secondary | ICD-10-CM | POA: Diagnosis not present

## 2021-05-26 DIAGNOSIS — Z13220 Encounter for screening for lipoid disorders: Secondary | ICD-10-CM | POA: Diagnosis not present

## 2021-05-26 MED ORDER — AMOXICILLIN-POT CLAVULANATE 875-125 MG PO TABS
ORAL_TABLET | ORAL | 0 refills | Status: DC
  Filled 2021-05-26: qty 20, 10d supply, fill #0

## 2021-05-26 MED ORDER — METHYLPHENIDATE HCL ER (OSM) 36 MG PO TBCR
EXTENDED_RELEASE_TABLET | ORAL | 0 refills | Status: DC
  Filled 2021-05-26: qty 30, 30d supply, fill #0

## 2021-06-02 ENCOUNTER — Ambulatory Visit: Payer: 59 | Admitting: Physical Therapy

## 2021-06-24 ENCOUNTER — Encounter: Payer: Self-pay | Admitting: Physical Therapy

## 2021-06-24 ENCOUNTER — Ambulatory Visit: Payer: 59 | Attending: Obstetrics and Gynecology | Admitting: Physical Therapy

## 2021-06-24 DIAGNOSIS — N814 Uterovaginal prolapse, unspecified: Secondary | ICD-10-CM | POA: Insufficient documentation

## 2021-06-24 DIAGNOSIS — M6281 Muscle weakness (generalized): Secondary | ICD-10-CM | POA: Diagnosis not present

## 2021-06-24 NOTE — Therapy (Signed)
OUTPATIENT PHYSICAL THERAPY FEMALE PELVIC EVALUATION   Patient Name: Brianna Alvarez MRN: [id number] DOB:25-Jul-1975, 46 y.o., female Today's Date: 06/24/2021   PT End of Session - 06/24/21 0806     Visit Number 1    Date for PT Re-Evaluation 09/16/21    Authorization Type [facility]    Authorization - Visit Number 1    Authorization - Number of Visits 24    PT Start Time 0800    PT Stop Time 0840    PT Time Calculation (min) 40 min    Activity Tolerance Patient tolerated treatment well    Behavior During Therapy [facility] for tasks assessed/performed             Past Medical History:  Diagnosis Date   Bell's palsy    Kidney stones    Lyme disease, unspecified    Mononucleosis    Tachycardia    Past Surgical History:  Procedure Laterality Date   LASIK     NEPHRECTOMY     2001   Patient Active Problem List   Diagnosis Date Noted   S/p nephrectomy 06/21/2018   Family history of colon cancer 06/21/2018   Family history of hemochromatosis 06/21/2018   IUD (intrauterine device) in place 06/21/2018    PCP: Vivi Barrack., MD  REFERRING PROVIDER: Louretta Shorten, MD  REFERRING DIAG: N81.11 Cystocele  THERAPY DIAG:  No diagnosis found.  Rationale for Evaluation and Treatment Rehabilitation  ONSET DATE: 12/30/2021  SUBJECTIVE:                                                                                                                                                                                           SUBJECTIVE STATEMENT: Patient was going to move a large piece of furniture and felt something fell. Has a feeling of a tampon in the vaginal area that is sticking out. I have lost 10 pounds. I have stopped running. Using Miralax for bowel movements. Breathing out with bowel movements. When I squat I feel the prolapse.     Patient confirms identification and approves PT to assess pelvic floor and treatment Yes   PAIN:  Are you having pain? No  PRECAUTIONS:  None  WEIGHT BEARING RESTRICTIONS No  FALLS:  Has patient fallen in last 6 months? No  LIVING ENVIRONMENT: Lives with: lives with their family   OCCUPATION: Designer, jewellery   PLOF: Independent  PATIENT GOALS work  with cystocele and how to manage it.   PERTINENT HISTORY:  None  BOWEL MOVEMENT Pain with bowel movement: No Type of bowel movement:Type (Bristol Stool Scale) Type 3-4, Frequency daily, and Strain Yes  sometimes.  Fully empty rectum: Yes: when strain will feel the prolapse Leakage: No Fiber supplement: Yes: Miralax  URINATION Pain with urination: No Fully empty bladder: Yes: sometimes will finish and more will come out  Stream: Strong Urgency: no Frequency: average Leakage:  none   INTERCOURSE Pain with intercourse: None  PREGNANCY Vaginal deliveries 3, first child had hematoma on the labia, second child stillborn and tore inside due to pushing fast,  Tearing Yes: in surgery was in for 4 hours to suture everything;  Patient had a lot pressure during pregnancies  PROLAPSE Cystocele grade 2    OBJECTIVE:   COGNITION:  Overall cognitive status: Within functional limits for tasks assessed      FUNCTIONAL TESTS:  Squat with therapist finger in the vaginal canal and did not detect prolapse.  Cough will bear downward and feel slight prolapse.    POSTURE:  Stand on one foot will shake. Slight increase in thoracic kyphosis.   LUMBARAROM/PROM  A/PROM A/PROM  eval  Right lateral flexion Decreased by 25%  Left lateral flexion Decreased by 25%  Right rotation Decreased by 25%   (Blank rows = not tested)  LOWER EXTREMITY ROM:  Passive ROM Right eval Left eval  Hip internal rotation 15 25  Hip external rotation 80 60   (Blank rows = not tested)  LOWER EXTREMITY MMT:  MMT Right eval Left eval  Hip extension  4/5   PELVIC MMT:   MMT eval  Vaginal Supine 3/5 ant, 2/5 right side , 1/5 left side; standing 1/5 ; standing patient will  bear down at end of contraction  (Blank rows = not tested)        PALPATION:   General  Left ilium rotated posteriorly; Fascial restrictions in lower abdomen; decreased movement on T10-L2, L3-L5 rotated right, sacrum rotated right                External Perineal Exam no tenderness                             Internal Pelvic Floor restrictions on the left side of the bladder and right side of the cervix; standing prolapse is grade 2   TONE: good  PROLAPSE: Cystocele grade 2 in supine and standing  TODAY'S TREATMENT  EVAL educated patient on her findings, how she is to correctly contract her pelvic floor and not bear down   PATIENT EDUCATION:  Education details: educated patient on prolapse, the findings in therapy and how they can affect the prolapse, sent patient you tube videos to work on Person educated: Patient Education method: Explanation and you tube videos Education comprehension: verbalized understanding and returned demonstration   HOME EXERCISE PROGRAM: Sent patient you tube videos on exercises with prolapse.   ASSESSMENT:  CLINICAL IMPRESSION: Patient is a 46 y.o. female who was seen today for physical therapy evaluation and treatment for cystocele. Patient noticed her prolapse 6 months ago when she has moved heavy furniture. She has had some reconstructive surgery vaginally after vaginal birth. She is taking Miralax to keep her stools regular. She does strain on occasion with bowel movements and will feel the prolapse. Her prolapse is present in supine and standing. Pelvic floor strength in supine is 2/5 anteriorly and 1/5 laterally and posteriorly. Pelvic floor strength standing is 1/5 anteriorly and 0/5 posteriorly. She will bear down on the last part of her pelvic floor contraction in standing. Left hip extension is  4/5. She has 25% decreased movement with bilateral sidebending. Left ilium is rotated posteriorly and sacrum rotated right. She has restrictions on the  left side of the bladder and right side of the cervix. Patient has fascial restrictions on the suprapubic area. Patient will benefit from skilled therapy to improve pelvic floor coordination and strength while reducing the present restrictions.    OBJECTIVE IMPAIRMENTS decreased activity tolerance, decreased coordination, decreased endurance, decreased ROM, decreased strength, and increased fascial restrictions.   ACTIVITY LIMITATIONS carrying, lifting, bending, standing, and toileting  PARTICIPATION LIMITATIONS: cleaning, laundry, shopping, and yard work  [place] are also affecting patient's functional outcome.   REHAB POTENTIAL: Excellent  CLINICAL DECISION MAKING: Stable/uncomplicated  EVALUATION COMPLEXITY: Low   GOALS: Goals reviewed with patient? Yes  SHORT TERM GOALS: Target date: 07/22/2021  Patient is educated on pressure management for prolapse.  Baseline: Goal status: INITIAL  2.  Patient is independent with initial HEP for core and pelvic floor to improve strength.  Baseline:  Goal status: INITIAL   LONG TERM GOALS: Target date: 09/16/2021   Patient is independent with advanced HEP for core and pelvic floor in different positions.  Baseline:  Goal status: INITIAL  2.  Patient is able to return to running with correct technique to reduce the strain on her prolapse.  Baseline:  Goal status: INITIAL  3.  Pelvic floor strength is >/= 3/5 with good circular contraction in supine and standing without bearing down.  Baseline:  Goal status: INITIAL  4.  Patient able to cough, laugh and sneeze without bearing down due to the ability to contract her lower abdominals.  Baseline:  Goal status: INITIAL    PLAN: PT FREQUENCY: 1x/week  PT DURATION: 12 weeks  PLANNED INTERVENTIONS: Therapeutic exercises, Therapeutic activity, Neuromuscular re-education, Patient/Family education, Joint mobilization, Dry Needling, Spinal mobilization, Cryotherapy,  Moist heat, Biofeedback, and Manual therapy  PLAN FOR NEXT SESSION: manual work to the pelvic floor to improve circular contraction, manual work to the lower abdomen to reduce restrictions; low level lower abdominal exercise, diaphragmatic breathing   Earlie Counts, PT 06/24/21 9:19 AM  06/24/2021, 8:07 AM,

## 2021-07-13 ENCOUNTER — Other Ambulatory Visit (HOSPITAL_BASED_OUTPATIENT_CLINIC_OR_DEPARTMENT_OTHER): Payer: Self-pay

## 2021-07-13 MED ORDER — METHYLPHENIDATE HCL ER (OSM) 36 MG PO TBCR
36.0000 mg | EXTENDED_RELEASE_TABLET | Freq: Every morning | ORAL | 0 refills | Status: DC
  Filled 2021-07-13: qty 30, 30d supply, fill #0

## 2021-07-15 ENCOUNTER — Ambulatory Visit: Payer: 59 | Attending: Obstetrics and Gynecology | Admitting: Physical Therapy

## 2021-07-15 ENCOUNTER — Encounter: Payer: Self-pay | Admitting: Physical Therapy

## 2021-07-15 DIAGNOSIS — M6281 Muscle weakness (generalized): Secondary | ICD-10-CM | POA: Insufficient documentation

## 2021-07-15 DIAGNOSIS — N814 Uterovaginal prolapse, unspecified: Secondary | ICD-10-CM | POA: Insufficient documentation

## 2021-07-15 NOTE — Therapy (Signed)
OUTPATIENT PHYSICAL THERAPY TREATMENT NOTE   Patient Name: Brianna Alvarez MRN: [id number] DOB:12-Sep-1975, 46 y.o., female Today's Date: 07/15/2021  PCP: Vivi Barrack., MD REFERRING PROVIDER: Louretta Shorten, MD  END OF SESSION:   PT End of Session - 07/15/21 1019     Visit Number 2    Date for PT Re-Evaluation 09/16/21    Authorization Type [facility]    Authorization - Visit Number 2    Authorization - Number of Visits 24    PT Start Time 0930    PT Stop Time 1010    PT Time Calculation (min) 40 min    Activity Tolerance Patient tolerated treatment well    Behavior During Therapy WFL for tasks assessed/performed             Past Medical History:  Diagnosis Date   Bell's palsy    Kidney stones    Lyme disease, unspecified    Mononucleosis    Tachycardia    Past Surgical History:  Procedure Laterality Date   LASIK     NEPHRECTOMY     2001   Patient Active Problem List   Diagnosis Date Noted   S/p nephrectomy 06/21/2018   Family history of colon cancer 06/21/2018   Family history of hemochromatosis 06/21/2018   IUD (intrauterine device) in place 06/21/2018    REFERRING DIAG: N81.11 Cystocele  THERAPY DIAG:  Muscle weakness (generalized)  Cystocele with prolapse  Rationale for Evaluation and Treatment Rehabilitation  PERTINENT HISTORY: none  PRECAUTIONS: None  SUBJECTIVE: I have not been exercising due to messing anything up.   PAIN:  Are you having pain? No  PATIENT GOALS work  with cystocele and how to manage it. OBJECTIVE: (objective measures completed at initial evaluation unless otherwise dated)  COGNITION:            Overall cognitive status: Within functional limits for tasks assessed                            FUNCTIONAL TESTS:  Squat with therapist finger in the vaginal canal and did not detect prolapse.  Cough will bear downward and feel slight prolapse.      POSTURE:  Stand on one foot will shake. Slight increase in  thoracic kyphosis.    LUMBARAROM/PROM   A/PROM A/PROM  eval  Right lateral flexion Decreased by 25%  Left lateral flexion Decreased by 25%  Right rotation Decreased by 25%   (Blank rows = not tested)   LOWER EXTREMITY ROM:   Passive ROM Right eval Left eval  Hip internal rotation 15 25  Hip external rotation 80 60   (Blank rows = not tested)   LOWER EXTREMITY MMT:   MMT Right eval Left eval  Hip extension   4/5    PELVIC MMT:   MMT eval  Vaginal Supine 3/5 ant, 2/5 right side , 1/5 left side; standing 1/5 ; standing patient will bear down at end of contraction  (Blank rows = not tested)         PALPATION:   General  Left ilium rotated posteriorly; Fascial restrictions in lower abdomen; decreased movement on T10-L2, L3-L5 rotated right, sacrum rotated right                 External Perineal Exam no tenderness  Internal Pelvic Floor restrictions on the left side of the bladder and right side of the cervix; standing prolapse is grade 2    TONE: good   PROLAPSE: Cystocele grade 2 in supine and standing   TODAY'S TREATMENT  07/15/2021 Manual: Soft tissue mobilization:manual work to the diaphragm in sitting  Myofascial release:using suction cup to the abdomen working on the fascial; fascial release manually to the abdomen Neuromuscular re-education: Core retraining: Core facilitation: diaphragmatic breathing to engage the abdominals and bring the rib cage downward with tactile cues and verbal cues   Supine transverse abdominus with breath 10x   Supine hip flexion isometric 10x each leg   Supine hip diagonal isometric to engage the obliques 10x each leg Therapeutic activities: Functional strengthening activities: sit to stand 10x with breath to engage the core and pelvic floor Self-care: Educated on sea pearls to use with exercise to hold the bladder up Education on breathing during the hardest part of an activity, keeping her rib cage  over the pelvis        PATIENT EDUCATION: 07/15/2021 Education details: Access Code: [email or web address]; educated patient sea sponges to assist for the prolapse Person educated: Patient Education method: Explanation, Demonstration, Tactile cues, Verbal cues, and Handouts Education comprehension: verbalized understanding, returned demonstration, verbal cues required, tactile cues required, and needs further education     HOME EXERCISE PROGRAM: 07/15/2021 Access Code: [email or web address] URL: [email or web address] Date: 07/15/2021 Prepared by: Earlie Counts  Exercises - Hooklying Transversus Abdominis Palpation  - 1 x daily - 7 x weekly - 3 sets - 10 reps - Hooklying Isometric Hip Flexion  - 1 x daily - 7 x weekly - 2 sets - 10 reps - Hooklying Isometric Hip Flexion with Opposite Arm  - 1 x daily - 7 x weekly - 2 sets - 10 reps - Seated Heel Raise  - 1 x daily - 7 x weekly - 1 sets - 10 reps Sent patient you tube videos on exercises with prolapse.    ASSESSMENT:   CLINICAL IMPRESSION: Patient is a 46 y.o. female who was seen today for physical therapy evaluation and treatment for cystocele. Patient was educated on the sea sponges to work on reducing her prolapse with exercise. Patient was educated on breath work to reduce her prolapse. She has improved fascial tightness after manual work of the abdomen. Patient needs many cues to engage the abdominals due to them not contracting well in awhile. Patient has difficulty with engaging her abdominals and feel them contracting.  Patient will benefit from skilled therapy to improve pelvic floor coordination and strength while reducing the present restrictions.      OBJECTIVE IMPAIRMENTS decreased activity tolerance, decreased coordination, decreased endurance, decreased ROM, decreased strength, and increased fascial restrictions.    ACTIVITY LIMITATIONS carrying, lifting, bending, standing, and toileting   PARTICIPATION LIMITATIONS: cleaning,  laundry, shopping, and yard work   [place] are also affecting patient's functional outcome.    REHAB POTENTIAL: Excellent   CLINICAL DECISION MAKING: Stable/uncomplicated   EVALUATION COMPLEXITY: Low     GOALS: Goals reviewed with patient? Yes   SHORT TERM GOALS: Target date: 07/22/2021   Patient is educated on pressure management for prolapse.  Baseline: Goal status: INITIAL   2.  Patient is independent with initial HEP for core and pelvic floor to improve strength.  Baseline:  Goal status: INITIAL     LONG TERM GOALS: Target date: 09/16/2021    Patient is independent with advanced HEP for core and pelvic  floor in different positions.  Baseline:  Goal status: INITIAL   2.  Patient is able to return to running with correct technique to reduce the strain on her prolapse.  Baseline:  Goal status: INITIAL   3.  Pelvic floor strength is >/= 3/5 with good circular contraction in supine and standing without bearing down.  Baseline:  Goal status: INITIAL   4.  Patient able to cough, laugh and sneeze without bearing down due to the ability to contract her lower abdominals.  Baseline:  Goal status: INITIAL       PLAN: PT FREQUENCY: 1x/week   PT DURATION: 12 weeks   PLANNED INTERVENTIONS: Therapeutic exercises, Therapeutic activity, Neuromuscular re-education, Patient/Family education, Joint mobilization, Dry Needling, Spinal mobilization, Cryotherapy, Moist heat, Biofeedback, and Manual therapy   PLAN FOR NEXT SESSION: manual work to the pelvic floor to improve circular contraction, low level lower abdominal exercise in supine with legs elevated and bringing the rib cage downward,  laying down to reduce the prolapse,  diaphragmatic breathing     Earlie Counts, PT 07/15/21 10:20 AM

## 2021-07-22 ENCOUNTER — Other Ambulatory Visit (HOSPITAL_BASED_OUTPATIENT_CLINIC_OR_DEPARTMENT_OTHER): Payer: Self-pay

## 2021-07-22 ENCOUNTER — Encounter: Payer: 59 | Admitting: Physical Therapy

## 2021-07-29 ENCOUNTER — Ambulatory Visit: Payer: 59 | Admitting: Physical Therapy

## 2021-07-29 ENCOUNTER — Encounter: Payer: Self-pay | Admitting: Physical Therapy

## 2021-07-29 DIAGNOSIS — M6281 Muscle weakness (generalized): Secondary | ICD-10-CM

## 2021-07-29 DIAGNOSIS — N814 Uterovaginal prolapse, unspecified: Secondary | ICD-10-CM

## 2021-07-29 NOTE — Therapy (Signed)
OUTPATIENT PHYSICAL THERAPY TREATMENT NOTE   Patient Name: Brianna Alvarez MRN: [id number] DOB:13-Mar-1975, 46 y.o., female Today's Date: 07/29/2021  PCP: Vivi Barrack., MD REFERRING PROVIDER: Louretta Shorten, MD  END OF SESSION:   PT End of Session - 07/29/21 0933     Visit Number 3    Date for PT Re-Evaluation 09/16/21    Authorization Type [facility]    Authorization - Visit Number 3    Authorization - Number of Visits 24    PT Start Time 0930    PT Stop Time 1010    PT Time Calculation (min) 40 min    Activity Tolerance Patient tolerated treatment well    Behavior During Therapy WFL for tasks assessed/performed             Past Medical History:  Diagnosis Date   Bell's palsy    Kidney stones    Lyme disease, unspecified    Mononucleosis    Tachycardia    Past Surgical History:  Procedure Laterality Date   LASIK     NEPHRECTOMY     2001   Patient Active Problem List   Diagnosis Date Noted   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   PAIN:  Are you having pain? No   PATIENT GOALS work  with cystocele and how to manage it. OBJECTIVE: (objective measures completed at initial evaluation unless otherwise dated)   COGNITION:            Overall cognitive status: Within functional limits for tasks assessed                            FUNCTIONAL TESTS:  Squat with therapist finger in the vaginal canal and did not detect prolapse.  Cough will bear downward and feel slight prolapse.      POSTURE:  Stand on one foot will shake. Slight increase in thoracic kyphosis.     LUMBARAROM/PROM   A/PROM A/PROM  eval  Right lateral flexion Decreased by 25%  Left lateral flexion Decreased by 25%  Right rotation Decreased by 25%   (Blank rows = not tested)   LOWER EXTREMITY ROM:   Passive ROM Right eval Left eval  Hip internal rotation 15 25  Hip external rotation 80 60   (Blank rows = not tested)   LOWER EXTREMITY MMT:   MMT Right eval Left eval  Hip extension   4/5    PELVIC MMT:   MMT eval 07/29/2021  Vaginal Supine 3/5 ant, 2/5 right side , 1/5 left side; standing 1/5 ; standing patient will bear down at end of contraction 4/5 with good hug in supine 3/5 holding for 3 sec with hug  (Blank rows = not tested)         PALPATION:   General  Left ilium rotated posteriorly; Fascial restrictions in lower abdomen; decreased movement on T10-L2, L3-L5 rotated right, sacrum rotated right                 External Perineal Exam no tenderness  Internal Pelvic Floor restrictions on the left side of the bladder and right side of the cervix; standing prolapse is grade 2    TONE: good   PROLAPSE: Cystocele grade 2 in supine and standing   TODAY'S TREATMENT  07/29/2021 Neuromuscular re-education: Core retraining:supine one legged bridge with pelvic floor contraction    Supine alternate hip flexion with lower abdominal contraction and not letting the pelvis move    Supine bilateral shoulder extension with red band and ball squeeze with pelvic floor and abdominal contraction    Standing pelvic floor contraction with tactile cues to engage the abdominals so she does not bear down    Supine pelvic floor contraction with lower abdominal contraction Pelvic floor contraction training:No emotional/communication barriers or cognitive limitation. Patient is motivated to learn. Patient understands and agrees with treatment goals and plan. PT explains patient will be examined in standing, sitting, and lying down to see how their muscles  and joints work. When they are ready, they will be asked to remove their underwear so PT can examine their perineum. The patient is also given the option of providing their own chaperone as one is not provided in our facility. The patient also has the right and is explained the right to defer or refuse any part of the evaluation or treatment including the internal exam. With the patient's consent, PT will use one gloved finger to gently assess the muscles of the pelvic floor, seeing how well it contracts and relaxes and if there is muscle symmetry. After, the patient will get dressed and PT and patient will discuss exam findings and plan of care. PT and patient discuss plan of care, schedule, attendance policy and HEP activities.   Going through the vaginal canal to work on the tissue working on the introitus and levator ani to increase the tissue mobility.     07/15/2021 Manual: Soft tissue mobilization:manual work to the diaphragm in sitting  Myofascial release:using suction cup to the abdomen working on the fascial; fascial release manually to the abdomen Neuromuscular re-education: Core retraining: Core facilitation: diaphragmatic breathing to engage the abdominals and bring the rib cage downward with tactile cues and verbal cues                         Supine transverse abdominus with breath 10x                         Supine hip flexion isometric 10x each leg                         Supine hip diagonal isometric to engage the obliques 10x each leg Therapeutic activities: Functional strengthening activities: sit to stand 10x with breath to engage the core and pelvic floor Self-care: Educated on sea pearls to use with exercise to hold the bladder up Education on breathing during the hardest part of an activity, keeping her rib cage over the pelvis         PATIENT EDUCATION: 07/29/2021 Education details: Access Code: [email or web address];  Person educated: Patient Education method: Explanation,  Demonstration, Tactile cues, Verbal cues, and Handouts Education comprehension: verbalized understanding, returned demonstration, verbal cues required, tactile cues required, and needs further education     HOME EXERCISE PROGRAM: 07/29/2021 Access Code: [email or web address] URL: [email or web address] Date: 07/29/2021 Prepared by: Earlie Counts  Exercises - Hooklying Transversus Abdominis Palpation  - 1 x daily - 7 x weekly -  3 sets - 10 reps - Seated Heel Raise  - 1 x daily - 7 x weekly - 1 sets - 10 reps - Supine March  - 1 x daily - 3 x weekly - 2 sets - 10 reps - Single Leg Bridge  - 1 x daily - 3 x weekly - 1 sets - 10 reps - Supine ASLR with Ab Bracing and Shoulder Extension with Anchored Resistance  - 1 x daily - 3 x weekly - 3 sets - 10 reps   ASSESSMENT:   CLINICAL IMPRESSION: Patient is a 46 y.o. female who was seen today for physical therapy treatment for cystocele. Pelvic floor strength increased to 4/5 in supine and 3/5 for standing.  Patient will bulge her pelvic floor in standing after 5 sec and when she stops contracting her lower abdominals. Patient is working with the sea sponge to assist with her prolapse. She is starting to work on her lower abdominal strength . Patient will benefit from skilled therapy to improve pelvic floor coordination and strength while reducing the present restrictions.      OBJECTIVE IMPAIRMENTS decreased activity tolerance, decreased coordination, decreased endurance, decreased ROM, decreased strength, and increased fascial restrictions.    ACTIVITY LIMITATIONS carrying, lifting, bending, standing, and toileting   PARTICIPATION LIMITATIONS: cleaning, laundry, shopping, and yard work   [place] are also affecting patient's functional outcome.    REHAB POTENTIAL: Excellent   CLINICAL DECISION MAKING: Stable/uncomplicated   EVALUATION COMPLEXITY: Low     GOALS: Goals reviewed with patient? Yes   SHORT TERM GOALS: Target  date: 07/22/2021   Patient is educated on pressure management for prolapse.  Baseline: Goal status: ongoing   2.  Patient is independent with initial HEP for core and pelvic floor to improve strength.  Baseline:  Goal status: met 07/29/2021     LONG TERM GOALS: Target date: 09/16/2021    Patient is independent with advanced HEP for core and pelvic floor in different positions.  Baseline:  Goal status: INITIAL   2.  Patient is able to return to running with correct technique to reduce the strain on her prolapse.  Baseline:  Goal status: INITIAL   3.  Pelvic floor strength is >/= 3/5 with good circular contraction in supine and standing without bearing down.  Baseline:  Goal status: INITIAL   4.  Patient able to cough, laugh and sneeze without bearing down due to the ability to contract her lower abdominals.  Baseline:  Goal status: INITIAL       PLAN: PT FREQUENCY: 1x/week   PT DURATION: 12 weeks   PLANNED INTERVENTIONS: Therapeutic exercises, Therapeutic activity, Neuromuscular re-education, Patient/Family education, Joint mobilization, Dry Needling, Spinal mobilization, Cryotherapy, Moist heat, Biofeedback, and Manual therapy   PLAN FOR NEXT SESSION:  low level lower abdominal exercise in supine with legs elevated and bringing the rib cage downward,  laying down to reduce the prolapse,  diaphragmatic breathing,    Earlie Counts, PT 07/29/21 12:11 PM

## 2021-08-03 ENCOUNTER — Ambulatory Visit: Payer: 59 | Admitting: Physical Therapy

## 2021-08-12 ENCOUNTER — Ambulatory Visit: Payer: 59 | Attending: Obstetrics and Gynecology | Admitting: Physical Therapy

## 2021-08-12 ENCOUNTER — Encounter: Payer: Self-pay | Admitting: Physical Therapy

## 2021-08-12 DIAGNOSIS — M6281 Muscle weakness (generalized): Secondary | ICD-10-CM | POA: Insufficient documentation

## 2021-08-12 DIAGNOSIS — N814 Uterovaginal prolapse, unspecified: Secondary | ICD-10-CM | POA: Insufficient documentation

## 2021-08-12 DIAGNOSIS — F4323 Adjustment disorder with mixed anxiety and depressed mood: Secondary | ICD-10-CM | POA: Diagnosis not present

## 2021-08-12 NOTE — Patient Instructions (Signed)

## 2021-08-12 NOTE — Therapy (Addendum)
OUTPATIENT PHYSICAL THERAPY TREATMENT NOTE   Patient Name: Brianna Alvarez MRN: [id number] DOB:May 28, 1975, 46 y.o., female Today's Date: 08/12/2021  PCP: Vivi Barrack., MD REFERRING PROVIDER: Louretta Shorten, MD  END OF SESSION:   PT End of Session - 08/12/21 1016     Visit Number 4    Date for PT Re-Evaluation 09/16/21    Authorization Type [facility]    Authorization - Visit Number 4    Authorization - Number of Visits 24    PT Start Time 4166    PT Stop Time 1053    PT Time Calculation (min) 38 min    Activity Tolerance Patient tolerated treatment well    Behavior During Therapy WFL for tasks assessed/performed             Past Medical History:  Diagnosis Date   Bell's palsy    Kidney stones    Lyme disease, unspecified    Mononucleosis    Tachycardia    Past Surgical History:  Procedure Laterality Date   LASIK     NEPHRECTOMY     2001   Patient Active Problem List   Diagnosis Date Noted   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 I will feel it when I strain to have a bowel movement. I am using the sponge for exercise.  PAIN:  Are you having pain? No   PATIENT GOALS work  with cystocele and how to manage it. OBJECTIVE: (objective measures completed at initial evaluation unless otherwise dated)   COGNITION:            Overall cognitive status: Within functional limits for tasks assessed                            FUNCTIONAL TESTS:  Squat with therapist finger in the vaginal canal and did not detect prolapse.  Cough will bear downward and feel slight  prolapse.      POSTURE:  Stand on one foot will shake. Slight increase in thoracic kyphosis.    LUMBARAROM/PROM   A/PROM A/PROM  eval  Right lateral flexion Decreased by 25%  Left lateral flexion Decreased by 25%  Right rotation Decreased by 25%   (Blank rows = not tested)   LOWER EXTREMITY ROM:   Passive ROM Right eval Left eval  Hip internal rotation 15 25  Hip external rotation 80 60   (Blank rows = not tested)   LOWER EXTREMITY MMT:   MMT Right eval Left eval  Hip extension   4/5    PELVIC MMT:   MMT eval 07/29/2021  Vaginal Supine 3/5 ant, 2/5 right side , 1/5 left side; standing 1/5 ; standing patient will bear down at end of contraction 4/5 with good hug in supine 3/5 holding for 3 sec with hug  (Blank rows = not tested)         PALPATION:   General  Left ilium rotated posteriorly; Fascial restrictions in lower abdomen; decreased movement on T10-L2, L3-L5 rotated right, sacrum rotated right  External Perineal Exam no tenderness                             Internal Pelvic Floor restrictions on the left side of the bladder and right side of the cervix; standing prolapse is grade 2    TONE: good   PROLAPSE: Cystocele grade 2 in supine and standing   TODAY'S TREATMENT  08/12/2021 Neuromuscular re-education: Form correction: abdominal exercises with patient understanding that she is to engage the abdominals the whole time, how to shorten the lever arm to make the exercises easier, making sure she is breathing during the exercise.  Exercises: Stretches/mobility:double knee to chest holding 30 sec    Trunk rotation holding 30 sec each side.  Therapeutic activities: Functional strengthening activities: Educated and patient return demonstration for correct toileting with breath, knees above hips.  Going up steps with using her quadriceps and contract the pelvic floor and abdominals Hiking with her not holding her breath, using her leg muscles as  she engages the core Self-care: Continue wearing her sponge with exercise     07/29/2021 Neuromuscular re-education: Core retraining:supine one legged bridge with pelvic floor contraction                                     Supine alternate hip flexion with lower abdominal contraction and not letting the pelvis move                                     Supine bilateral shoulder extension with red band and ball squeeze with pelvic floor and abdominal contraction                                     Standing pelvic floor contraction with tactile cues to engage the abdominals so she does not bear down                                     Supine pelvic floor contraction with lower abdominal contraction Pelvic floor contraction training:No emotional/communication barriers or cognitive limitation. Patient is motivated to learn. Patient understands and agrees with treatment goals and plan. PT explains patient will be examined in standing, sitting, and lying down to see how their muscles and joints work. When they are ready, they will be asked to remove their underwear so PT can examine their perineum. The patient is also given the option of providing their own chaperone as one is not provided in our facility. The patient also has the right and is explained the right to defer or refuse any part of the evaluation or treatment including the internal exam. With the patient's consent, PT will use one gloved finger to gently assess the muscles of the pelvic floor, seeing how well it contracts and relaxes and if there is muscle symmetry. After, the patient will get dressed and PT and patient will discuss exam findings and plan of care. PT and patient discuss plan of care, schedule, attendance policy and HEP activities.              Going through the vaginal canal to work on the tissue working  on the introitus and levator ani to increase the tissue mobility.     07/15/2021 Manual: Soft tissue mobilization:manual work to  the diaphragm in sitting  Myofascial release:using suction cup to the abdomen working on the fascial; fascial release manually to the abdomen Neuromuscular re-education: Core retraining: Core facilitation: diaphragmatic breathing to engage the abdominals and bring the rib cage downward with tactile cues and verbal cues                         Supine transverse abdominus with breath 10x                         Supine hip flexion isometric 10x each leg                         Supine hip diagonal isometric to engage the obliques 10x each leg Therapeutic activities: Functional strengthening activities: sit to stand 10x with breath to engage the core and pelvic floor Self-care: Educated on sea pearls to use with exercise to hold the bladder up Education on breathing during the hardest part of an activity, keeping her rib cage over the pelvis         PATIENT EDUCATION: 07/29/2021 Education details: Access Code: [email or web address];  Person educated: Patient Education method: Explanation, Demonstration, Tactile cues, Verbal cues, and Handouts Education comprehension: verbalized understanding, returned demonstration, verbal cues required, tactile cues required, and needs further education     HOME EXERCISE PROGRAM: 07/29/2021 Access Code: [email or web address] URL: [email or web address] Date: 07/29/2021 Prepared by: Earlie Counts   Exercises - Hooklying Transversus Abdominis Palpation  - 1 x daily - 7 x weekly - 3 sets - 10 reps - Seated Heel Raise  - 1 x daily - 7 x weekly - 1 sets - 10 reps - Supine March  - 1 x daily - 3 x weekly - 2 sets - 10 reps - Single Leg Bridge  - 1 x daily - 3 x weekly - 1 sets - 10 reps - Supine ASLR with Ab Bracing and Shoulder Extension with Anchored Resistance  - 1 x daily - 3 x weekly - 3 sets - 10 reps   ASSESSMENT:   CLINICAL IMPRESSION: Patient is a 46 y.o. female who was seen today for physical therapy treatment for cystocele. Patient only feels her prolapse  when she strains to toilet so she was educated on correct toileting technique. Patient was educated on how to breath during hard part of activity. She understands how to strengthen her abdominals without bearing down on her pelvic floor and engaging the pelvic floor correctly. Patient will benefit from skilled therapy to improve pelvic floor coordination and strength while reducing the present restrictions.      OBJECTIVE IMPAIRMENTS decreased activity tolerance, decreased coordination, decreased endurance, decreased ROM, decreased strength, and increased fascial restrictions.    ACTIVITY LIMITATIONS carrying, lifting, bending, standing, and toileting   PARTICIPATION LIMITATIONS: cleaning, laundry, shopping, and yard work   [place] are also affecting patient's functional outcome.    REHAB POTENTIAL: Excellent   CLINICAL DECISION MAKING: Stable/uncomplicated   EVALUATION COMPLEXITY: Low     GOALS: Goals reviewed with patient? Yes   SHORT TERM GOALS: Target date: 07/22/2021   Patient is educated on pressure management for prolapse.  Baseline: Goal status: Met 08/12/2021  2.  Patient is independent with initial HEP for core and pelvic floor to improve strength.  Baseline:  Goal status: met 07/29/2021     LONG TERM GOALS: Target date: 09/16/2021    Patient is independent with advanced HEP for core and pelvic floor in different positions.  Baseline:  Goal status: INITIAL   2.  Patient is able to return to running with correct technique to reduce the strain on her prolapse.  Baseline:  Goal status: met 08/12/2021  3.  Pelvic floor strength is >/= 3/5 with good circular contraction in supine and standing without bearing down.  Baseline:  Goal status: Met 08/12/2021  4.  Patient able to cough, laugh and sneeze without bearing down due to the ability to contract her lower abdominals.  Baseline:  Goal status: Met 08/12/2021       PLAN: PT FREQUENCY: 1x/week   PT  DURATION: 12 weeks   PLANNED INTERVENTIONS: Therapeutic exercises, Therapeutic activity, Neuromuscular re-education, Patient/Family education, Joint mobilization, Dry Needling, Spinal mobilization, Cryotherapy, Moist heat, Biofeedback, and Manual therapy   PLAN FOR NEXT SESSION:  if doing well then discharge.      Earlie Counts, PT 08/12/21 10:57 AM    PHYSICAL THERAPY DISCHARGE SUMMARY  Visits from Start of Care: 4  Current functional level related to goals / functional outcomes: See above. Patient did not return to therapy.    Remaining deficits: See above.    Education / Equipment: HEP   Patient agrees to discharge. Patient goals were met. Patient is being discharged due to meeting the stated rehab goals. All goals were met except the finalization of HEP. Thank you for the referral. Earlie Counts, PT 11/03/21 8:19 AM

## 2021-08-21 ENCOUNTER — Telehealth: Payer: 59 | Admitting: Nurse Practitioner

## 2021-08-21 DIAGNOSIS — U071 COVID-19: Secondary | ICD-10-CM

## 2021-08-21 MED ORDER — MOLNUPIRAVIR EUA 200MG CAPSULE: 4.0000 | ORAL_CAPSULE | Freq: Two times a day (BID) | ORAL | 0 refills | Status: AC

## 2021-08-21 NOTE — Progress Notes (Signed)
Virtual Visit Consent   Brianna Alvarez, you are scheduled for a virtual visit with a [facility] provider today. Just as with appointments in the office, your consent must be obtained to participate. Your consent will be active for this visit and any virtual visit you may have with one of our providers in the next 365 days. If you have a MyChart account, a copy of this consent can be sent to you electronically.  As this is a virtual visit, video technology does not allow for your provider to perform a traditional examination. This may limit your provider's ability to fully assess your condition. If your provider identifies any concerns that need to be evaluated in person or the need to arrange testing (such as labs, EKG, etc.), we will make arrangements to do so. Although advances in technology are sophisticated, we cannot ensure that it will always work on either your end or our end. If the connection with a video visit is poor, the visit may have to be switched to a telephone visit. With either a video or telephone visit, we are not always able to ensure that we have a secure connection.  By engaging in this virtual visit, you consent to the provision of healthcare and authorize for your insurance to be billed (if applicable) for the services provided during this visit. Depending on your insurance coverage, you may receive a charge related to this service.  I need to obtain your verbal consent now. Are you willing to proceed with your visit today? Brianna Alvarez has provided verbal consent on 08/21/2021 for a virtual visit (video or telephone). Gildardo Pounds, NP  Date: 08/21/2021 3:04 PM  Virtual Visit via Video Note   I, Gildardo Pounds, connected with  Brianna Alvarez  ([id number], 06-06-1975) on 08/21/21 at  3:00 PM EDT by a video-enabled telemedicine application and verified that I am speaking with the correct person using two identifiers.  Location: Patient: Virtual Visit  Location Patient: Home Provider: Virtual Visit Location Provider: Home Office   I discussed the limitations of evaluation and management by telemedicine and the availability of in person appointments. The patient expressed understanding and agreed to proceed.    History of Present Illness: Brianna Alvarez is a 46 y.o. who identifies as a female who was assigned female at birth, and is being seen today for Positive home covid antigen test today> COVID POSITIVE   Symptoms onset 4 days ago. Currently with fever, chills, body aches, headache, nasal congestion, nausea and fatigue.    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   Family history of colon cancer 06/21/2018   Family history of hemochromatosis 06/21/2018   IUD (intrauterine device) in place 06/21/2018    Allergies:  Allergies  Allergen Reactions   Darvocet [Propoxyphene N-Acetaminophen] Nausea And Vomiting   Dilaudid [Hydromorphone Hcl] Nausea And Vomiting   Hydromorphone Nausea And Vomiting   Sulfathiazole Sodium Swelling   Medications:  Current Outpatient Medications:    levonorgestrel (MIRENA) 20 MCG/24HR IUD, 1 each by Intrauterine route once., Disp: , Rfl:    methylphenidate 36 MG PO CR tablet, Take 1 tablet (36 mg total) by mouth every morning., Disp: 30 tablet, Rfl: 0   molnupiravir EUA (LAGEVRIO) 200 mg CAPS capsule, Take 4 capsules (800 mg total) by mouth 2 (two) times daily for 5 days., Disp: 40 capsule, Rfl: 0  Observations/Objective: Patient is well-developed, well-nourished in no acute distress.  Resting comfortably at home.  Head is normocephalic, atraumatic.  No labored breathing.  Speech is clear and coherent with logical content.  Patient is alert and oriented at baseline.    Assessment and Plan: 1. Positive self-administered antigen test for COVID-19 - molnupiravir EUA (LAGEVRIO) 200 mg CAPS capsule; Take 4 capsules (800 mg total) by mouth 2 (two)  times daily for 5 days.  Dispense: 40 capsule; Refill: 0  Please keep well-hydrated and get plenty of rest. Start a saline nasal rinse to flush out your nasal passages. You can use plain Mucinex to help thin congestion. Take tylenol or motrin for pain relief.  Please continue to take your prescribed medications as directed.     You were to quarantine for 5 days from onset of your symptoms.  After day 5, if you have had no fever and you are feeling better, you can end quarantine but need to mask for an additional 5 days. After day 5 if you have a fever or are having significant symptoms, please quarantine for full 10 days.   If you note any worsening of symptoms, any significant shortness of breath or any chest pain, please seek ER evaluation ASAP.  Please do not delay care!  Follow Up Instructions: I discussed the assessment and treatment plan with the patient. The patient was provided an opportunity to ask questions and all were answered. The patient agreed with the plan and demonstrated an understanding of the instructions.  A copy of instructions were sent to the patient via MyChart unless otherwise noted below.    The patient was advised to call back or seek an in-person evaluation if the symptoms worsen or if the condition fails to improve as anticipated.  Time:  I spent 11 minutes with the patient via telehealth technology discussing the above problems/concerns.    Gildardo Pounds, NP

## 2021-08-21 NOTE — Patient Instructions (Addendum)
  Brianna Alvarez, thank you for joining Gildardo Pounds, NP for today's virtual visit.  While this provider is not your primary care provider (PCP), if your PCP is located in our provider database this encounter information will be shared with them immediately following your visit.  Consent: (Patient) Brianna Alvarez provided verbal consent for this virtual visit at the beginning of the encounter.  Current Medications:  Current Outpatient Medications:    molnupiravir EUA (LAGEVRIO) 200 mg CAPS capsule, Take 4 capsules (800 mg total) by mouth 2 (two) times daily for 5 days., Disp: 40 capsule, Rfl: 0   levonorgestrel (MIRENA) 20 MCG/24HR IUD, 1 each by Intrauterine route once., Disp: , Rfl:    methylphenidate 36 MG PO CR tablet, Take 1 tablet (36 mg total) by mouth every morning., Disp: 30 tablet, Rfl: 0   Medications ordered in this encounter:  Meds ordered this encounter  Medications   molnupiravir EUA (LAGEVRIO) 200 mg CAPS capsule    Sig: Take 4 capsules (800 mg total) by mouth 2 (two) times daily for 5 days.    Dispense:  40 capsule    Refill:  0    Order Specific Question:   Supervising Provider    Answer:   Sabra Heck, [place]     *If you need refills on other medications prior to your next appointment, please contact your pharmacy*  Follow-Up: Call back or seek an in-person evaluation if the symptoms worsen or if the condition fails to improve as anticipated.  Other Instructions Please keep well-hydrated and get plenty of rest. Start a saline nasal rinse to flush out your nasal passages. You can use plain Mucinex to help thin congestion. Take tylenol or motrin for pain relief.  Please continue to take your prescribed medications as directed.    If you have been instructed to have an in-person evaluation today at a local Urgent Care facility, please use the link below. It will take you to a list of all of our available [facility], including address,  phone number and hours of operation. Please do not delay care.  [facility]  If you or a family member do not have a primary care provider, use the link below to schedule a visit and establish care. When you choose a [facility] primary care physician or advanced practice provider, you gain a long-term partner in health. Find a Primary Care Provider  Learn more about [facility]'s in-office and virtual care options: [facility]

## 2021-08-26 DIAGNOSIS — F4323 Adjustment disorder with mixed anxiety and depressed mood: Secondary | ICD-10-CM | POA: Diagnosis not present

## 2021-08-30 DIAGNOSIS — F4323 Adjustment disorder with mixed anxiety and depressed mood: Secondary | ICD-10-CM | POA: Diagnosis not present

## 2021-09-02 ENCOUNTER — Ambulatory Visit: Payer: 59 | Admitting: Physical Therapy

## 2021-09-07 ENCOUNTER — Other Ambulatory Visit (HOSPITAL_BASED_OUTPATIENT_CLINIC_OR_DEPARTMENT_OTHER): Payer: Self-pay

## 2021-09-07 MED ORDER — METHYLPHENIDATE HCL ER (OSM) 36 MG PO TBCR
EXTENDED_RELEASE_TABLET | ORAL | 0 refills | Status: DC
  Filled 2021-09-07: qty 30, 30d supply, fill #0

## 2021-09-09 ENCOUNTER — Ambulatory Visit: Payer: 59 | Admitting: Physical Therapy

## 2021-09-15 DIAGNOSIS — F3342 Major depressive disorder, recurrent, in full remission: Secondary | ICD-10-CM | POA: Diagnosis not present

## 2021-09-15 DIAGNOSIS — F4323 Adjustment disorder with mixed anxiety and depressed mood: Secondary | ICD-10-CM | POA: Diagnosis not present

## 2021-09-15 DIAGNOSIS — F900 Attention-deficit hyperactivity disorder, predominantly inattentive type: Secondary | ICD-10-CM | POA: Diagnosis not present

## 2021-09-16 ENCOUNTER — Ambulatory Visit: Payer: 59 | Admitting: Physical Therapy

## 2021-09-22 DIAGNOSIS — F4323 Adjustment disorder with mixed anxiety and depressed mood: Secondary | ICD-10-CM | POA: Diagnosis not present

## 2021-09-29 ENCOUNTER — Telehealth: Payer: Self-pay | Admitting: Physical Therapy

## 2021-09-29 NOTE — Telephone Encounter (Signed)
Spoke to patient on the phone. She will be calling back to make an appointment in 4 weeks to go over her program.  Earlie Counts, PT '@8'$ /31/2023@ 8:29 AM

## 2021-09-30 DIAGNOSIS — F4323 Adjustment disorder with mixed anxiety and depressed mood: Secondary | ICD-10-CM | POA: Diagnosis not present

## 2021-10-06 DIAGNOSIS — F4323 Adjustment disorder with mixed anxiety and depressed mood: Secondary | ICD-10-CM | POA: Diagnosis not present

## 2021-10-07 DIAGNOSIS — F4323 Adjustment disorder with mixed anxiety and depressed mood: Secondary | ICD-10-CM | POA: Diagnosis not present

## 2021-10-11 DIAGNOSIS — F4323 Adjustment disorder with mixed anxiety and depressed mood: Secondary | ICD-10-CM | POA: Diagnosis not present

## 2021-10-19 DIAGNOSIS — F4323 Adjustment disorder with mixed anxiety and depressed mood: Secondary | ICD-10-CM | POA: Diagnosis not present

## 2021-10-20 ENCOUNTER — Other Ambulatory Visit: Payer: Self-pay | Admitting: Obstetrics and Gynecology

## 2021-10-20 DIAGNOSIS — Z1231 Encounter for screening mammogram for malignant neoplasm of breast: Secondary | ICD-10-CM

## 2021-10-21 ENCOUNTER — Other Ambulatory Visit (HOSPITAL_BASED_OUTPATIENT_CLINIC_OR_DEPARTMENT_OTHER): Payer: Self-pay

## 2021-10-21 DIAGNOSIS — F4323 Adjustment disorder with mixed anxiety and depressed mood: Secondary | ICD-10-CM | POA: Diagnosis not present

## 2021-10-21 MED ORDER — ALPRAZOLAM 0.25 MG PO TABS
0.2500 mg | ORAL_TABLET | Freq: Three times a day (TID) | ORAL | 3 refills | Status: DC | PRN
  Filled 2021-10-21 – 2021-11-03 (×2): qty 60, 20d supply, fill #0

## 2021-10-24 ENCOUNTER — Encounter: Payer: Self-pay | Admitting: *Deleted

## 2021-11-02 ENCOUNTER — Other Ambulatory Visit (HOSPITAL_BASED_OUTPATIENT_CLINIC_OR_DEPARTMENT_OTHER): Payer: Self-pay

## 2021-11-03 ENCOUNTER — Other Ambulatory Visit (HOSPITAL_BASED_OUTPATIENT_CLINIC_OR_DEPARTMENT_OTHER): Payer: Self-pay

## 2021-11-03 DIAGNOSIS — F4323 Adjustment disorder with mixed anxiety and depressed mood: Secondary | ICD-10-CM | POA: Diagnosis not present

## 2021-11-03 MED ORDER — METHYLPHENIDATE HCL ER (OSM) 36 MG PO TBCR
36.0000 mg | EXTENDED_RELEASE_TABLET | Freq: Every morning | ORAL | 0 refills | Status: DC
  Filled 2021-11-03: qty 30, 30d supply, fill #0

## 2021-11-08 DIAGNOSIS — Z23 Encounter for immunization: Secondary | ICD-10-CM | POA: Diagnosis not present

## 2021-11-10 DIAGNOSIS — F4323 Adjustment disorder with mixed anxiety and depressed mood: Secondary | ICD-10-CM | POA: Diagnosis not present

## 2021-11-17 DIAGNOSIS — F4323 Adjustment disorder with mixed anxiety and depressed mood: Secondary | ICD-10-CM | POA: Diagnosis not present

## 2021-11-18 ENCOUNTER — Ambulatory Visit
Admission: RE | Admit: 2021-11-18 | Discharge: 2021-11-18 | Disposition: A | Discharge status: Home / Self Care | Payer: 59 | Source: Ambulatory Visit | Attending: Obstetrics and Gynecology | Admitting: Obstetrics and Gynecology

## 2021-11-18 DIAGNOSIS — Z1231 Encounter for screening mammogram for malignant neoplasm of breast: Secondary | ICD-10-CM

## 2021-11-24 DIAGNOSIS — F4323 Adjustment disorder with mixed anxiety and depressed mood: Secondary | ICD-10-CM | POA: Diagnosis not present

## 2021-12-02 DIAGNOSIS — F4323 Adjustment disorder with mixed anxiety and depressed mood: Secondary | ICD-10-CM | POA: Diagnosis not present

## 2021-12-08 ENCOUNTER — Encounter: Payer: Self-pay | Admitting: Nurse Practitioner

## 2021-12-08 DIAGNOSIS — F4323 Adjustment disorder with mixed anxiety and depressed mood: Secondary | ICD-10-CM | POA: Diagnosis not present

## 2021-12-15 DIAGNOSIS — F4323 Adjustment disorder with mixed anxiety and depressed mood: Secondary | ICD-10-CM | POA: Diagnosis not present

## 2021-12-19 ENCOUNTER — Other Ambulatory Visit (HOSPITAL_BASED_OUTPATIENT_CLINIC_OR_DEPARTMENT_OTHER): Payer: Self-pay

## 2021-12-19 MED ORDER — METHYLPHENIDATE HCL ER 36 MG PO TB24
36.0000 mg | ORAL_TABLET | Freq: Every morning | ORAL | 0 refills | Status: DC
  Filled 2021-12-19: qty 30, 30d supply, fill #0

## 2021-12-20 ENCOUNTER — Other Ambulatory Visit (HOSPITAL_BASED_OUTPATIENT_CLINIC_OR_DEPARTMENT_OTHER): Payer: Self-pay

## 2021-12-21 DIAGNOSIS — F4323 Adjustment disorder with mixed anxiety and depressed mood: Secondary | ICD-10-CM | POA: Diagnosis not present

## 2021-12-29 DIAGNOSIS — F4323 Adjustment disorder with mixed anxiety and depressed mood: Secondary | ICD-10-CM | POA: Diagnosis not present

## 2022-01-05 DIAGNOSIS — F4323 Adjustment disorder with mixed anxiety and depressed mood: Secondary | ICD-10-CM | POA: Diagnosis not present

## 2022-01-12 ENCOUNTER — Other Ambulatory Visit (HOSPITAL_COMMUNITY): Payer: Self-pay

## 2022-01-12 ENCOUNTER — Encounter: Payer: Self-pay | Admitting: Nurse Practitioner

## 2022-01-12 ENCOUNTER — Ambulatory Visit (INDEPENDENT_AMBULATORY_CARE_PROVIDER_SITE_OTHER): Payer: 59 | Admitting: Nurse Practitioner

## 2022-01-12 VITALS — BP 112/62 | HR 88 | Ht 66.0 in | Wt 159.2 lb

## 2022-01-12 DIAGNOSIS — Z83719 Family history of colon polyps, unspecified: Secondary | ICD-10-CM

## 2022-01-12 DIAGNOSIS — Z1211 Encounter for screening for malignant neoplasm of colon: Secondary | ICD-10-CM

## 2022-01-12 DIAGNOSIS — F4323 Adjustment disorder with mixed anxiety and depressed mood: Secondary | ICD-10-CM | POA: Diagnosis not present

## 2022-01-12 MED ORDER — NA SULFATE-K SULFATE-MG SULF 17.5-3.13-1.6 GM/177ML PO SOLN
1.0000 | Freq: Once | ORAL | 0 refills | Status: AC
  Filled 2022-01-12: qty 354, 30d supply, fill #0

## 2022-01-12 NOTE — Progress Notes (Signed)
Attending Physician's Attestation   I have reviewed the chart.   I agree with the Advanced Practitioner's note, impression, and recommendations with any updates as below. In setting significant family history of colon polyps in her first degree relative/mother, this makes sense that a 5-year follow up/surveillance should be done (even in setting of previous genetic testing being negative for mother).   Justice Britain, MD [facility] Office # [phone]

## 2022-01-12 NOTE — Progress Notes (Signed)
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  She presents to our office today to schedule a colonoscopy.  She underwent a colonoscopy by Dr. Ardis Hughs 01/10/2017 which was normal.  At that time, she was advised to repeat a colonoscopy in 10 years.  However, she recently accompanied her mother to her colonoscopy completed by Dr. Rush Landmark.  Mother with history of 6 colon polyps in the past and 12 polyps were removed at the time of her recent colonoscopy.  Mother previously tested negative for Lynch syndrome.  Maternal aunt and uncle with history of colon cancer.  Due to her mother's history of significant colon polyps, she was advised by Dr. Rush Landmark to undergo colonoscopy in 5 years instead of 10.  She denies having any upper or lower abdominal pain.  She has occasional constipation.  No rectal bleeding or black stools.  She has infrequent reflux for which she takes Rolaids.  She describes having 2 brief episodes of esophageal spasms within the past 2 years, last episode occurred 6 months ago without recurrence.  She denies ever having an EGD.  No other complaints at this time.  Labs 05/26/2021: WBC 5.8.  Hemoglobin 13.6.  Hematocrit 39.3.  Platelet 202.  TSH 1.67.  Sodium 137.  Potassium 3.8.  BUN 15.  Creatinine 0.86.  Alk phos 61.  Total bili 0.5.  AST 14.  ALT 13.  Past Medical History:  Diagnosis Date   Bell's palsy    Kidney stones    Lyme disease, unspecified    Mononucleosis    Tachycardia    Past Surgical History:  Procedure Laterality Date   LASIK     NEPHRECTOMY     2001   Social History: She is married.  She has 2 daughters.  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  Non-smoker.  No alcohol use.  Family History: Mother with history of  depression hypothyroidism and numerous colon polyps as noted in the HPI.  Father with history of hemochromatosis.  Allergies  Allergen Reactions   Darvocet [Propoxyphene N-Acetaminophen] Nausea And Vomiting   Dilaudid [Hydromorphone Hcl] Nausea And Vomiting   Hydromorphone Nausea And Vomiting   Sulfathiazole Sodium Swelling     Outpatient Encounter Medications as of 01/12/2022  Medication Sig   levonorgestrel (MIRENA) 20 MCG/24HR IUD 1 each by Intrauterine route once.   methylphenidate 36 MG PO CR tablet Take 1 tablet (36 mg total) by mouth in the morning.   ALPRAZolam (XANAX) 0.25 MG tablet Take 1 tablet by mouth 3 times daily as needed (Patient not taking: Reported on 01/12/2022)   methylphenidate 36 MG PO CR tablet Take 1 tablet (36 mg total) by mouth every morning. (Patient not taking: Reported on 01/12/2022)   No facility-administered encounter medications on file as of 01/12/2022.    REVIEW OF SYSTEMS:  Gen: Denies fever, sweats or chills. No weight loss.  CV: Denies chest pain, palpitations or edema. Resp: Denies cough, shortness of breath of hemoptysis.  GI: Denies heartburn, dysphagia, stomach or lower abdominal pain. No diarrhea or constipation.  GU : Denies urinary burning, blood in urine, increased urinary frequency or incontinence. MS: Denies joint pain, muscles aches or weakness. Derm: Denies rash, itchiness, skin lesions or unhealing ulcers. Psych: Mild situational depression, past  anxiety resolved.  Heme: Denies bruising, easy bleeding. Neuro:  Denies headaches, dizziness or paresthesias. Endo:  Denies any problems with DM, thyroid or adrenal function.  PHYSICAL EXAM: BP 112/62   Pulse 88   Ht _0  (1.676 m)   Wt 159 lb 4 oz (72.2 kg)   BMI 25.70 kg/m  General: 46 year old female in no acute distress. Head: Normocephalic and atraumatic. Eyes:  Sclerae non-icteric, conjunctive pink. Ears: Normal auditory acuity. Mouth: Dentition intact. No ulcers or  lesions.  Neck: Supple, no lymphadenopathy or thyromegaly.  Lungs: Clear bilaterally to auscultation without wheezes, crackles or rhonchi. Heart: Regular rate and rhythm. No murmur, rub or gallop appreciated.  Abdomen: Soft, nontender, non distended. No masses. No hepatosplenomegaly. Normoactive bowel sounds x 4 quadrants.  Rectal: Deferred. Musculoskeletal: Symmetrical with no gross deformities. Skin: Warm and dry. No rash or lesions on visible extremities. Extremities: No edema. Neurological: Alert oriented x 4, no focal deficits.  Psychological:  Alert and cooperative. Normal mood and affect.  ASSESSMENT AND PLAN:  44) 46 year old female presents to schedule screening colonoscopy.  Normal colonoscopy 12/2016.  Mother with history of 40+ colon polyps, tested negative for Lynch syndrome.  Maternal aunt and uncle with history of colon cancer. -Colonoscopy benefits and risks discussed including risk with sedation, risk of bleeding, perforation and infection  -Further recommendations to be determined after colonoscopy completed  2) Constipation -Take Miralax 1 capful mixed in 8 ounces of water at bed time for constipation as tolerated  3) Possible esophageal spasms x 2 episodes over the past 2 years, last episode occurred 6 months ago -Patient to follow-up in office if symptoms recur        CC:  Vivi Barrack, MD

## 2022-01-12 NOTE — Patient Instructions (Signed)
You have been scheduled for a colonoscopy. Please follow written instructions given to you at your visit today.  Please pick up your prep supplies at the pharmacy within the next 1-3 days. If you use inhalers (even only as needed), please bring them with you on the day of your procedure.  Due to recent changes in healthcare laws, you may see the results of your imaging and laboratory studies on MyChart before your provider has had a chance to review them.  We understand that in some cases there may be results that are confusing or concerning to you. Not all laboratory results come back in the same time frame and the provider may be waiting for multiple results in order to interpret others.  Please give us 48 hours in order for your provider to thoroughly review all the results before contacting the office for clarification of your results.   Thank you for trusting me with your gastrointestinal care!   Colleen Kennedy-Smith, CRNP   

## 2022-01-19 DIAGNOSIS — F4323 Adjustment disorder with mixed anxiety and depressed mood: Secondary | ICD-10-CM | POA: Diagnosis not present

## 2022-02-02 DIAGNOSIS — F4323 Adjustment disorder with mixed anxiety and depressed mood: Secondary | ICD-10-CM | POA: Diagnosis not present

## 2022-02-09 DIAGNOSIS — F4323 Adjustment disorder with mixed anxiety and depressed mood: Secondary | ICD-10-CM | POA: Diagnosis not present

## 2022-02-13 DIAGNOSIS — H6012 Cellulitis of left external ear: Secondary | ICD-10-CM | POA: Diagnosis not present

## 2022-02-23 DIAGNOSIS — F4323 Adjustment disorder with mixed anxiety and depressed mood: Secondary | ICD-10-CM | POA: Diagnosis not present

## 2022-02-24 ENCOUNTER — Telehealth: Payer: 59 | Admitting: Emergency Medicine

## 2022-02-24 ENCOUNTER — Other Ambulatory Visit (HOSPITAL_BASED_OUTPATIENT_CLINIC_OR_DEPARTMENT_OTHER): Payer: Self-pay

## 2022-02-24 DIAGNOSIS — R519 Headache, unspecified: Secondary | ICD-10-CM | POA: Diagnosis not present

## 2022-02-24 MED ORDER — VALACYCLOVIR HCL 1 G PO TABS
1000.0000 mg | ORAL_TABLET | Freq: Two times a day (BID) | ORAL | 0 refills | Status: DC
  Filled 2022-02-24: qty 14, 7d supply, fill #0

## 2022-02-24 MED ORDER — GABAPENTIN 300 MG PO CAPS
300.0000 mg | ORAL_CAPSULE | Freq: Three times a day (TID) | ORAL | 0 refills | Status: DC
  Filled 2022-02-24: qty 21, 7d supply, fill #0

## 2022-02-24 MED ORDER — FLUCONAZOLE 150 MG PO TABS
150.0000 mg | ORAL_TABLET | Freq: Once | ORAL | 0 refills | Status: AC
  Filled 2022-02-24: qty 1, 1d supply, fill #0

## 2022-02-24 NOTE — Progress Notes (Signed)
Virtual Visit Consent   Brianna Alvarez, you are scheduled for a virtual visit with a [facility] provider today. Just as with appointments in the office, your consent must be obtained to participate. Your consent will be active for this visit and any virtual visit you may have with one of our providers in the next 365 days. If you have a MyChart account, a copy of this consent can be sent to you electronically.  As this is a virtual visit, video technology does not allow for your provider to perform a traditional examination. This may limit your provider's ability to fully assess your condition. If your provider identifies any concerns that need to be evaluated in person or the need to arrange testing (such as labs, EKG, etc.), we will make arrangements to do so. Although advances in technology are sophisticated, we cannot ensure that it will always work on either your end or our end. If the connection with a video visit is poor, the visit may have to be switched to a telephone visit. With either a video or telephone visit, we are not always able to ensure that we have a secure connection.  By engaging in this virtual visit, you consent to the provision of healthcare and authorize for your insurance to be billed (if applicable) for the services provided during this visit. Depending on your insurance coverage, you may receive a charge related to this service.  I need to obtain your verbal consent now. Are you willing to proceed with your visit today? Brianna Alvarez has provided verbal consent on 02/24/2022 for a virtual visit (video or telephone). Montine Circle, PA-C  Date: 02/24/2022 10:28 AM  Virtual Visit via Video Note   I, Montine Circle, connected with  Brianna Alvarez  ([id number], Apr 26, 1975) on 02/24/22 at 10:30 AM EST by a video-enabled telemedicine application and verified that I am speaking with the correct person using two identifiers.  Location: Patient: Virtual Visit  Location Patient: Home Provider: Virtual Visit Location Provider: Home Office   I discussed the limitations of evaluation and management by telemedicine and the availability of in person appointments. The patient expressed understanding and agreed to proceed.    History of Present Illness: Brianna Alvarez is a 47 y.o. who identifies as a female who was assigned female at birth, and is being seen today for concern for shingles.  Saw an ENT last week and was prescribed antibiotics (Augmentin and Cipro) for 10 days.  States that symptoms improved, but now she is having worsening symptoms.  She states that she having burning/stabbing and tingling sensation on the side of her face.  She denies numbness. She has been treating her symptoms with OTC meds.  She denies rash.      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   Family history of colon cancer 06/21/2018   Family history of hemochromatosis 06/21/2018   IUD (intrauterine device) in place 06/21/2018    Allergies:  Allergies  Allergen Reactions   Darvocet [Propoxyphene N-Acetaminophen] Nausea And Vomiting   Dilaudid [Hydromorphone Hcl] Nausea And Vomiting   Hydromorphone Nausea And Vomiting   Sulfathiazole Sodium Swelling   Medications:  Current Outpatient Medications:    ALPRAZolam (XANAX) 0.25 MG tablet, Take 1 tablet by mouth 3 times daily as needed (Patient not taking: Reported on 01/12/2022), Disp: 60 tablet, Rfl: 3   levonorgestrel (MIRENA) 20 MCG/24HR IUD, 1 each by Intrauterine route  once., Disp: , Rfl:    methylphenidate 36 MG PO CR tablet, Take 1 tablet (36 mg total) by mouth in the morning., Disp: 30 tablet, Rfl: 0   methylphenidate 36 MG PO CR tablet, Take 1 tablet (36 mg total) by mouth every morning. (Patient not taking: Reported on 01/12/2022), Disp: 30 tablet, Rfl: 0  Observations/Objective: Patient is well-developed, well-nourished in no acute distress.   Resting comfortably at home.  Head is normocephalic, atraumatic.  No labored breathing.  Speech is clear and coherent with logical content.  Patient is alert and oriented at baseline.  No visible redness or rash   Assessment and Plan: 1. Face pain  Meds ordered this encounter  Medications   fluconazole (DIFLUCAN) 150 MG tablet    Sig: Take 1 tablet (150 mg total) by mouth once for 1 dose.    Dispense:  1 tablet    Refill:  0    Order Specific Question:   Supervising Provider    Answer:   Chase Picket [[id number]]   valACYclovir (VALTREX) 1000 MG tablet    Sig: Take 1 tablet (1,000 mg total) by mouth 2 (two) times daily.    Dispense:  14 tablet    Refill:  0    Order Specific Question:   Supervising Provider    Answer:   Chase Picket [[id number]]   gabapentin (NEURONTIN) 300 MG capsule    Sig: Take 1 capsule (300 mg total) by mouth 3 (three) times daily.    Dispense:  21 capsule    Refill:  0    Order Specific Question:   Supervising Provider    Answer:   Chase Picket [[id number]]    Patient concerned about developing singles.  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  She was treated with abx and it resolved.  She states that she has neuralgias on her face that concern her for shingles.    She denies mastoid tenderness.    I stressed with the patient that she needs to be seen in person if her symptoms worsen.  Will treat as above since we are going into the weekend.  Patient assures me she will go in for in-person evaluation if something changes.  Follow Up Instructions: I discussed the assessment and treatment plan with the patient. The patient was provided an opportunity to ask questions and all were answered. The patient agreed with the plan and demonstrated an understanding of the instructions.  A copy of instructions were sent to the patient via MyChart unless otherwise noted below.     The patient was advised to call back or seek an  in-person evaluation if the symptoms worsen or if the condition fails to improve as anticipated.  Time:  I spent 13 minutes with the patient via telehealth technology discussing the above problems/concerns.    Montine Circle, PA-C

## 2022-02-24 NOTE — Patient Instructions (Signed)
Nadean Corwin, thank you for joining Montine Circle, PA-C for today's virtual visit.  While this provider is not your primary care provider (PCP), if your PCP is located in our provider database this encounter information will be shared with them immediately following your visit.   [facility] account gives you access to today's visit and all your visits, tests, and labs performed at [facility] " click here if you don't have a [facility] account or go to [email or web address]  Consent: (Patient) Nadean Corwin provided verbal consent for this virtual visit at the beginning of the encounter.  Current Medications:  Current Outpatient Medications:    fluconazole (DIFLUCAN) 150 MG tablet, Take 1 tablet (150 mg total) by mouth once for 1 dose., Disp: 1 tablet, Rfl: 0   gabapentin (NEURONTIN) 300 MG capsule, Take 1 capsule (300 mg total) by mouth 3 (three) times daily., Disp: 21 capsule, Rfl: 0   valACYclovir (VALTREX) 1000 MG tablet, Take 1 tablet (1,000 mg total) by mouth 2 (two) times daily., Disp: 14 tablet, Rfl: 0   ALPRAZolam (XANAX) 0.25 MG tablet, Take 1 tablet by mouth 3 times daily as needed (Patient not taking: Reported on 01/12/2022), Disp: 60 tablet, Rfl: 3   levonorgestrel (MIRENA) 20 MCG/24HR IUD, 1 each by Intrauterine route once., Disp: , Rfl:    methylphenidate 36 MG PO CR tablet, Take 1 tablet (36 mg total) by mouth in the morning., Disp: 30 tablet, Rfl: 0   methylphenidate 36 MG PO CR tablet, Take 1 tablet (36 mg total) by mouth every morning. (Patient not taking: Reported on 01/12/2022), Disp: 30 tablet, Rfl: 0   Medications ordered in this encounter:  Meds ordered this encounter  Medications   fluconazole (DIFLUCAN) 150 MG tablet    Sig: Take 1 tablet (150 mg total) by mouth once for 1 dose.    Dispense:  1 tablet    Refill:  0    Order Specific Question:   Supervising Provider    Answer:   Chase Picket [[id number]]    valACYclovir (VALTREX) 1000 MG tablet    Sig: Take 1 tablet (1,000 mg total) by mouth 2 (two) times daily.    Dispense:  14 tablet    Refill:  0    Order Specific Question:   Supervising Provider    Answer:   Chase Picket [[id number]]   gabapentin (NEURONTIN) 300 MG capsule    Sig: Take 1 capsule (300 mg total) by mouth 3 (three) times daily.    Dispense:  21 capsule    Refill:  0    Order Specific Question:   Supervising Provider    Answer:   Chase Picket [id number]     *If you need refills on other medications prior to your next appointment, please contact your pharmacy*  Follow-Up: Call back or seek an in-person evaluation if the symptoms worsen or if the condition fails to improve as anticipated.  [facility] [phone]  Other Instructions    If you have been instructed to have an in-person evaluation today at a local Urgent Care facility, please use the link below. It will take you to a list of all of our available [facility], including address, phone number and hours of operation. Please do not delay care.   Urgent Cares  If you or a family member do not have a primary care provider, use the link below to schedule a visit and  establish care. When you choose a [facility] primary care physician or advanced practice provider, you gain a long-term partner in health. Find a Primary Care Provider  Learn more about [facility]'s in-office and virtual care options: [facility]

## 2022-02-27 ENCOUNTER — Other Ambulatory Visit (HOSPITAL_BASED_OUTPATIENT_CLINIC_OR_DEPARTMENT_OTHER): Payer: Self-pay

## 2022-02-27 MED ORDER — FLUCONAZOLE 150 MG PO TABS
150.0000 mg | ORAL_TABLET | Freq: Once | ORAL | 1 refills | Status: AC
  Filled 2022-02-27: qty 1, 1d supply, fill #0

## 2022-02-27 MED ORDER — METHYLPHENIDATE HCL ER 36 MG PO TB24
36.0000 mg | ORAL_TABLET | Freq: Every morning | ORAL | 0 refills | Status: DC
  Filled 2022-02-27: qty 30, 30d supply, fill #0

## 2022-03-02 DIAGNOSIS — F4323 Adjustment disorder with mixed anxiety and depressed mood: Secondary | ICD-10-CM | POA: Diagnosis not present

## 2022-03-03 DIAGNOSIS — M79672 Pain in left foot: Secondary | ICD-10-CM | POA: Diagnosis not present

## 2022-03-09 ENCOUNTER — Encounter: Payer: Self-pay | Admitting: Gastroenterology

## 2022-03-13 ENCOUNTER — Encounter: Payer: Self-pay | Admitting: Gastroenterology

## 2022-03-14 ENCOUNTER — Encounter: Payer: Self-pay | Admitting: Gastroenterology

## 2022-03-14 NOTE — Telephone Encounter (Signed)
Chart reviewed. Pt sent same message twice.  Message was responded to by another RN.

## 2022-03-16 DIAGNOSIS — F4323 Adjustment disorder with mixed anxiety and depressed mood: Secondary | ICD-10-CM | POA: Diagnosis not present

## 2022-03-17 ENCOUNTER — Encounter: Payer: Self-pay | Admitting: Gastroenterology

## 2022-03-17 ENCOUNTER — Ambulatory Visit: Payer: 59 | Admitting: Gastroenterology

## 2022-03-17 VITALS — BP 117/83 | HR 69 | Temp 97.3°F | Resp 12 | Ht 66.0 in | Wt 159.0 lb

## 2022-03-17 DIAGNOSIS — Z83719 Family history of colon polyps, unspecified: Secondary | ICD-10-CM

## 2022-03-17 DIAGNOSIS — Z1211 Encounter for screening for malignant neoplasm of colon: Secondary | ICD-10-CM

## 2022-03-17 DIAGNOSIS — K529 Noninfective gastroenteritis and colitis, unspecified: Secondary | ICD-10-CM

## 2022-03-17 MED ORDER — SODIUM CHLORIDE 0.9 % IV SOLN: 500.0000 mL | Freq: Once | INTRAVENOUS | Status: DC

## 2022-03-17 NOTE — Op Note (Signed)
[facility] Patient Name: Brianna Alvarez Procedure Date: 03/17/2022 2:28 PM MRN: [id number] Endoscopist: Justice Britain , MD, [id number] Age: 47 Referring MD:  Date of Birth: 06-03-1975 Gender: Female Account #: [id number] Procedure:                Colonoscopy Indications:              Colon cancer screening in patient at increased                            risk: Family history of 1st-degree relative with                            multiple colon polyps Medicines:                Monitored Anesthesia Care Procedure:                Pre-Anesthesia Assessment:                           - Prior to the procedure, a History and Physical                            was performed, and patient medications and                            allergies were reviewed. The patient's tolerance of                            previous anesthesia was also reviewed. The risks                            and benefits of the procedure and the sedation                            options and risks were discussed with the patient.                            All questions were answered, and informed consent                            was obtained. Prior Anticoagulants: The patient has                            taken no anticoagulant or antiplatelet agents. ASA                            Grade Assessment: II - A patient with mild systemic                            disease. After reviewing the risks and benefits,                            the patient was deemed in satisfactory condition to  undergo the procedure.                           After obtaining informed consent, the colonoscope                            was passed under direct vision. Throughout the                            procedure, the patient's blood pressure, pulse, and                            oxygen saturations were monitored continuously. The                            Olympus CF-HQ190L [id number])  Colonoscope was                            introduced through the anus and advanced to the 10                            cm into the ileum. The colonoscopy was performed                            without difficulty. The patient tolerated the                            procedure. The quality of the bowel preparation was                            good. The terminal ileum, ileocecal valve,                            appendiceal orifice, and rectum were photographed. Scope In: 2:42:00 PM Scope Out: 2:58:59 PM Scope Withdrawal Time: 0 hours 12 minutes 37 seconds  Total Procedure Duration: 0 hours 16 minutes 59 seconds  Findings:                 The digital rectal exam findings include                            hemorrhoids. Pertinent negatives include no                            palpable rectal lesions.                           Segmental mild inflammation characterized by                            congestion (edema) and erosions was found in the                            terminal ileum. Biopsies were taken with a cold  forceps for histology.                           Normal mucosa was found in the entire colon                            otherwise.                           Non-bleeding non-thrombosed internal hemorrhoids                            were found during retroflexion, during perianal                            exam and during digital exam. The hemorrhoids were                            Grade II (internal hemorrhoids that prolapse but                            reduce spontaneously). Complications:            No immediate complications. Estimated Blood Loss:     Estimated blood loss was minimal. Impression:               - Hemorrhoids found on digital rectal exam.                           - Mild inflammation (erosions/congestion) was found                            in the ileum secondary to ileitis. Biopsied.                           - Normal  mucosa in the entire examined colon.                           - Non-bleeding non-thrombosed internal hemorrhoids. Recommendation:           - The patient will be observed post-procedure,                            until all discharge criteria are met.                           - Discharge patient to home.                           - Patient has a contact number available for                            emergencies. The signs and symptoms of potential                            delayed complications were discussed with the  patient. Return to normal activities tomorrow.                            Written discharge instructions were provided to the                            patient.                           - High fiber diet.                           - Use FiberCon 1-2 tablets PO daily.                           - Continue present medications.                           - Await pathology results.                           - Repeat colonoscopy in 5 years for surveillance                            based on pathology results and family history.                           - The findings and recommendations were discussed                            with the patient.                           - The findings and recommendations were discussed                            with the patient's family. Justice Britain, MD 03/17/2022 3:06:40 PM

## 2022-03-17 NOTE — Patient Instructions (Addendum)
Recommendation:           - The patient will be observed post-procedure,                            until all discharge criteria are met.                           - Discharge patient to home.                           - Patient has a contact number available for                            emergencies. The signs and symptoms of potential                            delayed complications were discussed with the                            patient. Return to normal activities tomorrow.                            Written discharge instructions were provided to the                            patient.                           - High fiber diet.                           - Use FiberCon 1-2 tablets PO daily.                           - Continue present medications.                           - Await pathology results.                           - Repeat colonoscopy in 5 years for surveillance                            based on pathology results and family history.                           - The findings and recommendations were discussed                            with the patient.                           - The findings and recommendations were discussed                            with the patient's family.  Handouts on high  fiber diet and hemorrhoids given.  YOU HAD AN ENDOSCOPIC PROCEDURE TODAY AT [facility]:   Refer to the procedure report that was given to you for any specific questions about what was found during the examination.  If the procedure report does not answer your questions, please call your gastroenterologist to clarify.  If you requested that your care partner not be given the details of your procedure findings, then the procedure report has been included in a sealed envelope for you to review at your convenience later.  YOU SHOULD EXPECT: Some feelings of bloating in the abdomen. Passage of more gas than usual.  Walking can help get rid of the air that was put  into your GI tract during the procedure and reduce the bloating. If you had a lower endoscopy (such as a colonoscopy or flexible sigmoidoscopy) you may notice spotting of blood in your stool or on the toilet paper. If you underwent a bowel prep for your procedure, you may not have a normal bowel movement for a few days.  Please Note:  You might notice some irritation and congestion in your nose or some drainage.  This is from the oxygen used during your procedure.  There is no need for concern and it should clear up in a day or so.  SYMPTOMS TO REPORT IMMEDIATELY:  Following lower endoscopy (colonoscopy or flexible sigmoidoscopy):  Excessive amounts of blood in the stool  Significant tenderness or worsening of abdominal pains  Swelling of the abdomen that is new, acute  Fever of 100F or higher  For urgent or emergent issues, a gastroenterologist can be reached at any hour by calling [phone]. Do not use MyChart messaging for urgent concerns.   DIET:  We do recommend a small meal at first, but then you may proceed to your regular diet.  Drink plenty of fluids but you should avoid alcoholic beverages for 24 hours.  ACTIVITY:  You should plan to take it easy for the rest of today and you should NOT DRIVE or use heavy machinery until tomorrow (because of the sedation medicines used during the test).    FOLLOW UP: Our staff will call the number listed on your records the next business day following your procedure.  We will call around 7:15- 8:00 am to check on you and address any questions or concerns that you may have regarding the information given to you following your procedure. If we do not reach you, we will leave a message.     If any biopsies were taken you will be contacted by phone or by letter within the next 1-3 weeks.  Please call us at [phone] if you have not heard about the biopsies in 3 weeks.    SIGNATURES/CONFIDENTIALITY: You and/or your care partner have signed  paperwork which will be entered into your electronic medical record.  These signatures attest to the fact that that the information above on your After Visit Summary has been reviewed and is understood.  Full responsibility of the confidentiality of this discharge information lies with you and/or your care-partner.

## 2022-03-17 NOTE — Progress Notes (Signed)
Pt's states no medical or surgical changes since previsit or office visit. VS assessed by N.C ?

## 2022-03-17 NOTE — Progress Notes (Unsigned)
GASTROENTEROLOGY PROCEDURE H&P NOTE   Primary Care Physician: Vickii Penna, MD  HPI: Brianna Alvarez is a 47 y.o. female who presents for Colonoscopy for screening (high risk with mother >20 polyps).  Past Medical History:  Diagnosis Date   Bell's palsy    Kidney stones    Lyme disease, unspecified    Mononucleosis    Tachycardia    Past Surgical History:  Procedure Laterality Date   LASIK     NEPHRECTOMY     2001   Current Outpatient Medications  Medication Sig Dispense Refill   levonorgestrel (MIRENA) 20 MCG/24HR IUD 1 each by Intrauterine route once.     methylphenidate 36 MG PO CR tablet Take 1 tablet (36 mg total) by mouth every morning. 30 tablet 0   ALPRAZolam (XANAX) 0.25 MG tablet Take 1 tablet by mouth 3 times daily as needed (Patient not taking: Reported on 01/12/2022) 60 tablet 3   gabapentin (NEURONTIN) 300 MG capsule Take 1 capsule (300 mg total) by mouth 3 (three) times daily. (Patient not taking: Reported on 03/17/2022) 21 capsule 0   Current Facility-Administered Medications  Medication Dose Route Frequency Provider Last Rate Last Admin   0.9 %  sodium chloride infusion  500 mL Intravenous Once Mansouraty, Telford Nab., MD        Current Outpatient Medications:    levonorgestrel (MIRENA) 20 MCG/24HR IUD, 1 each by Intrauterine route once., Disp: , Rfl:    methylphenidate 36 MG PO CR tablet, Take 1 tablet (36 mg total) by mouth every morning., Disp: 30 tablet, Rfl: 0   ALPRAZolam (XANAX) 0.25 MG tablet, Take 1 tablet by mouth 3 times daily as needed (Patient not taking: Reported on 01/12/2022), Disp: 60 tablet, Rfl: 3   gabapentin (NEURONTIN) 300 MG capsule, Take 1 capsule (300 mg total) by mouth 3 (three) times daily. (Patient not taking: Reported on 03/17/2022), Disp: 21 capsule, Rfl: 0  Current Facility-Administered Medications:    0.9 %  sodium chloride infusion, 500 mL, Intravenous, Once, Mansouraty, Telford Nab., MD Allergies  Allergen  Reactions   Darvocet [Propoxyphene N-Acetaminophen] Nausea And Vomiting   Dilaudid [Hydromorphone Hcl] Nausea And Vomiting   Hydromorphone Nausea And Vomiting   Sulfathiazole Sodium Swelling   Family History  Problem Relation Age of Onset   Colon polyps Mother    Hyperthyroidism Mother    Depression Mother    Colon cancer Maternal Aunt    Colon cancer Maternal Uncle    Cancer Father    Hemachromatosis Father    Breast cancer Paternal Uncle    Esophageal cancer Neg Hx    Stomach cancer Neg Hx    Rectal cancer Neg Hx    Social History   Socioeconomic History   Marital status: Married    Spouse name: Not on file   Number of children: Not on file   Years of education: Not on file   Highest education level: Not on file  Occupational History   Not on file  Tobacco Use   Smoking status: Never   Smokeless tobacco: Never  Vaping Use   Vaping Use: Never used  Substance and Sexual Activity   Alcohol use: Yes    Comment: occas   Drug use: No   Sexual activity: Yes  Other Topics Concern   Not on file  Social History Narrative   Not on file   Social Determinants of Health   Financial Resource Strain: Not on file  Food Insecurity: Not on file  Transportation Needs: Not on file  Physical Activity: Not on file  Stress: Not on file  Social Connections: Not on file  Intimate Partner Violence: Not on file    Physical Exam: Today's Vitals   03/17/22 1346  BP: (!) 102/58  Pulse: 86  Temp: (!) 97.3 F (36.3 C)  TempSrc: Skin  SpO2: 100%  Weight: 159 lb (72.1 kg)  Height: 5' 6"$  (1.676 m)   Body mass index is 25.66 kg/m. GEN: NAD EYE: Sclerae anicteric ENT: MMM CV: Non-tachycardic GI: Soft, NT/ND NEURO:  Alert & Oriented x 3  Lab Results: No results for input(s): "WBC", "HGB", "HCT", "PLT" in the last 72 hours. BMET No results for input(s): "NA", "K", "CL", "CO2", "GLUCOSE", "BUN", "CREATININE", "CALCIUM" in the last 72 hours. LFT No results for input(s):  "PROT", "ALBUMIN", "AST", "ALT", "ALKPHOS", "BILITOT", "BILIDIR", "IBILI" in the last 72 hours. PT/INR No results for input(s): "LABPROT", "INR" in the last 72 hours.   Impression / Plan: This is a 47 y.o.female who presents for Colonoscopy for screening (high risk with mother >20 polyps).  The risks and benefits of endoscopic evaluation/treatment were discussed with the patient and/or family; these include but are not limited to the risk of perforation, infection, bleeding, missed lesions, lack of diagnosis, severe illness requiring hospitalization, as well as anesthesia and sedation related illnesses.  The patient's history has been reviewed, patient examined, no change in status, and deemed stable for procedure.  The patient and/or family is agreeable to proceed.    Justice Britain, MD [facility] Advanced Endoscopy Office # [id number]

## 2022-03-17 NOTE — Progress Notes (Signed)
Stable to recovery, report given to RN

## 2022-03-20 ENCOUNTER — Telehealth: Payer: Self-pay | Admitting: *Deleted

## 2022-03-20 NOTE — Telephone Encounter (Signed)
  Follow up Call-     03/17/2022    1:46 PM  Call back number  Post procedure Call Back phone  # [phone]  Permission to leave phone message Yes     Patient questions:  Do you have a fever, pain , or abdominal swelling? No. Pain Score  0 *  Have you tolerated food without any problems? Yes.    Have you been able to return to your normal activities? Yes.    Do you have any questions about your discharge instructions: Diet   No. Medications  No. Follow up visit  No.  Do you have questions or concerns about your Care? No.  Actions: * If pain score is 4 or above: No action needed, pain <4.

## 2022-03-23 DIAGNOSIS — F4323 Adjustment disorder with mixed anxiety and depressed mood: Secondary | ICD-10-CM | POA: Diagnosis not present

## 2022-03-27 ENCOUNTER — Encounter: Payer: Self-pay | Admitting: Gastroenterology

## 2022-03-30 DIAGNOSIS — F4323 Adjustment disorder with mixed anxiety and depressed mood: Secondary | ICD-10-CM | POA: Diagnosis not present

## 2022-03-31 DIAGNOSIS — F4323 Adjustment disorder with mixed anxiety and depressed mood: Secondary | ICD-10-CM | POA: Diagnosis not present

## 2022-04-10 DIAGNOSIS — F4323 Adjustment disorder with mixed anxiety and depressed mood: Secondary | ICD-10-CM | POA: Diagnosis not present

## 2022-04-20 DIAGNOSIS — F4323 Adjustment disorder with mixed anxiety and depressed mood: Secondary | ICD-10-CM | POA: Diagnosis not present

## 2022-04-27 DIAGNOSIS — F4323 Adjustment disorder with mixed anxiety and depressed mood: Secondary | ICD-10-CM | POA: Diagnosis not present

## 2022-05-11 DIAGNOSIS — F4323 Adjustment disorder with mixed anxiety and depressed mood: Secondary | ICD-10-CM | POA: Diagnosis not present

## 2022-05-25 DIAGNOSIS — F4323 Adjustment disorder with mixed anxiety and depressed mood: Secondary | ICD-10-CM | POA: Diagnosis not present

## 2022-05-31 ENCOUNTER — Other Ambulatory Visit (HOSPITAL_BASED_OUTPATIENT_CLINIC_OR_DEPARTMENT_OTHER): Payer: Self-pay

## 2022-06-01 DIAGNOSIS — F4323 Adjustment disorder with mixed anxiety and depressed mood: Secondary | ICD-10-CM | POA: Diagnosis not present

## 2022-06-05 ENCOUNTER — Other Ambulatory Visit (HOSPITAL_BASED_OUTPATIENT_CLINIC_OR_DEPARTMENT_OTHER): Payer: Self-pay

## 2022-06-06 ENCOUNTER — Other Ambulatory Visit (HOSPITAL_BASED_OUTPATIENT_CLINIC_OR_DEPARTMENT_OTHER): Payer: Self-pay

## 2022-06-06 MED ORDER — METHYLPHENIDATE HCL ER 36 MG PO TB24
36.0000 mg | ORAL_TABLET | Freq: Every morning | ORAL | 0 refills | Status: DC
  Filled 2022-06-06: qty 30, 30d supply, fill #0

## 2022-06-08 ENCOUNTER — Other Ambulatory Visit (HOSPITAL_BASED_OUTPATIENT_CLINIC_OR_DEPARTMENT_OTHER): Payer: Self-pay

## 2022-06-08 DIAGNOSIS — F411 Generalized anxiety disorder: Secondary | ICD-10-CM | POA: Diagnosis not present

## 2022-06-08 DIAGNOSIS — F3342 Major depressive disorder, recurrent, in full remission: Secondary | ICD-10-CM | POA: Diagnosis not present

## 2022-06-08 DIAGNOSIS — F900 Attention-deficit hyperactivity disorder, predominantly inattentive type: Secondary | ICD-10-CM | POA: Diagnosis not present

## 2022-06-08 MED ORDER — METHYLPHENIDATE HCL 5 MG PO TABS
ORAL_TABLET | ORAL | 0 refills | Status: DC
  Filled 2022-06-08: qty 60, 30d supply, fill #0

## 2022-06-08 MED ORDER — ALPRAZOLAM 0.25 MG PO TABS
ORAL_TABLET | ORAL | 3 refills | Status: DC
  Filled 2022-06-08: qty 60, 20d supply, fill #0

## 2022-06-09 ENCOUNTER — Other Ambulatory Visit: Payer: Self-pay

## 2022-06-29 DIAGNOSIS — F4323 Adjustment disorder with mixed anxiety and depressed mood: Secondary | ICD-10-CM | POA: Diagnosis not present

## 2022-07-07 ENCOUNTER — Other Ambulatory Visit (HOSPITAL_BASED_OUTPATIENT_CLINIC_OR_DEPARTMENT_OTHER): Payer: Self-pay

## 2022-07-07 MED ORDER — PHENTERMINE HCL 37.5 MG PO TABS
37.5000 mg | ORAL_TABLET | Freq: Every morning | ORAL | 2 refills | Status: DC
  Filled 2022-07-07 – 2022-08-04 (×2): qty 30, 30d supply, fill #0

## 2022-07-24 ENCOUNTER — Other Ambulatory Visit (HOSPITAL_BASED_OUTPATIENT_CLINIC_OR_DEPARTMENT_OTHER): Payer: Self-pay

## 2022-08-04 ENCOUNTER — Other Ambulatory Visit: Payer: Self-pay

## 2022-08-04 ENCOUNTER — Other Ambulatory Visit (HOSPITAL_BASED_OUTPATIENT_CLINIC_OR_DEPARTMENT_OTHER): Payer: Self-pay

## 2022-10-18 ENCOUNTER — Other Ambulatory Visit (HOSPITAL_BASED_OUTPATIENT_CLINIC_OR_DEPARTMENT_OTHER): Payer: Self-pay

## 2022-10-19 ENCOUNTER — Other Ambulatory Visit (HOSPITAL_BASED_OUTPATIENT_CLINIC_OR_DEPARTMENT_OTHER): Payer: Self-pay

## 2022-10-19 ENCOUNTER — Other Ambulatory Visit: Payer: Self-pay

## 2022-10-19 MED ORDER — METHYLPHENIDATE HCL ER 36 MG PO TB24
36.0000 mg | ORAL_TABLET | Freq: Every morning | ORAL | 0 refills | Status: DC
  Filled 2022-10-19: qty 30, 30d supply, fill #0

## 2022-10-20 ENCOUNTER — Other Ambulatory Visit: Payer: Self-pay | Admitting: Obstetrics and Gynecology

## 2022-10-20 ENCOUNTER — Other Ambulatory Visit (HOSPITAL_BASED_OUTPATIENT_CLINIC_OR_DEPARTMENT_OTHER): Payer: Self-pay

## 2022-10-20 DIAGNOSIS — Z1231 Encounter for screening mammogram for malignant neoplasm of breast: Secondary | ICD-10-CM

## 2022-10-23 ENCOUNTER — Other Ambulatory Visit: Payer: Self-pay

## 2022-11-23 ENCOUNTER — Ambulatory Visit: Payer: 59

## 2022-11-23 ENCOUNTER — Ambulatory Visit
Admission: RE | Admit: 2022-11-23 | Discharge: 2022-11-23 | Disposition: A | Discharge status: Home / Self Care | Payer: Managed Care, Other (non HMO) | Source: Ambulatory Visit | Attending: Obstetrics and Gynecology | Admitting: Obstetrics and Gynecology

## 2022-11-23 DIAGNOSIS — Z1231 Encounter for screening mammogram for malignant neoplasm of breast: Secondary | ICD-10-CM

## 2023-01-05 ENCOUNTER — Other Ambulatory Visit: Payer: Self-pay

## 2023-01-05 ENCOUNTER — Other Ambulatory Visit (HOSPITAL_BASED_OUTPATIENT_CLINIC_OR_DEPARTMENT_OTHER): Payer: Self-pay

## 2023-01-05 MED ORDER — METHYLPHENIDATE HCL 5 MG PO TABS
5.0000 mg | ORAL_TABLET | Freq: Two times a day (BID) | ORAL | 0 refills | Status: DC
  Filled 2023-01-05: qty 10, 5d supply, fill #0
  Filled 2023-01-05: qty 50, 25d supply, fill #0

## 2023-01-05 MED ORDER — METHYLPHENIDATE HCL ER (OSM) 36 MG PO TBCR: 36.0000 mg | EXTENDED_RELEASE_TABLET | Freq: Every morning | ORAL | 0 refills | Status: DC

## 2023-01-05 MED ORDER — METHYLPHENIDATE HCL ER (OSM) 36 MG PO TBCR
36.0000 mg | EXTENDED_RELEASE_TABLET | Freq: Every morning | ORAL | 0 refills | Status: DC
  Filled 2023-01-05: qty 30, 30d supply, fill #0

## 2023-01-05 MED ORDER — METHYLPHENIDATE HCL 5 MG PO TABS: 5.0000 mg | ORAL_TABLET | Freq: Two times a day (BID) | ORAL | 0 refills | Status: DC

## 2023-04-18 ENCOUNTER — Other Ambulatory Visit (HOSPITAL_BASED_OUTPATIENT_CLINIC_OR_DEPARTMENT_OTHER): Payer: Self-pay

## 2023-04-18 MED ORDER — METHYLPHENIDATE HCL ER (OSM) 36 MG PO TBCR
36.0000 mg | EXTENDED_RELEASE_TABLET | Freq: Every morning | ORAL | 0 refills | Status: DC
  Filled 2023-04-18: qty 30, 30d supply, fill #0

## 2023-04-18 MED ORDER — METHYLPHENIDATE HCL ER (OSM) 36 MG PO TBCR
36.0000 mg | EXTENDED_RELEASE_TABLET | Freq: Every morning | ORAL | 0 refills | Status: DC
  Filled 2023-06-08: qty 30, 30d supply, fill #0

## 2023-06-08 ENCOUNTER — Other Ambulatory Visit (HOSPITAL_BASED_OUTPATIENT_CLINIC_OR_DEPARTMENT_OTHER): Payer: Self-pay

## 2023-06-15 ENCOUNTER — Other Ambulatory Visit (HOSPITAL_BASED_OUTPATIENT_CLINIC_OR_DEPARTMENT_OTHER): Payer: Self-pay

## 2023-06-15 MED ORDER — METHYLPHENIDATE HCL ER (OSM) 36 MG PO TBCR
36.0000 mg | EXTENDED_RELEASE_TABLET | Freq: Every morning | ORAL | 0 refills | Status: DC
  Filled 2023-06-15 – 2023-11-02 (×2): qty 30, 30d supply, fill #0

## 2023-09-12 ENCOUNTER — Other Ambulatory Visit (HOSPITAL_BASED_OUTPATIENT_CLINIC_OR_DEPARTMENT_OTHER): Payer: Self-pay

## 2023-09-12 MED ORDER — METHYLPHENIDATE HCL 5 MG PO TABS
5.0000 mg | ORAL_TABLET | Freq: Two times a day (BID) | ORAL | 0 refills | Status: DC
  Filled 2023-09-12 (×2): qty 60, 30d supply, fill #0

## 2023-09-12 MED ORDER — METHYLPHENIDATE HCL ER (OSM) 36 MG PO TBCR
36.0000 mg | EXTENDED_RELEASE_TABLET | Freq: Every morning | ORAL | 0 refills | Status: DC
  Filled 2023-09-12: qty 30, 30d supply, fill #0

## 2023-11-02 ENCOUNTER — Other Ambulatory Visit (HOSPITAL_BASED_OUTPATIENT_CLINIC_OR_DEPARTMENT_OTHER): Payer: Self-pay

## 2023-11-02 MED ORDER — METHYLPHENIDATE HCL ER (OSM) 36 MG PO TBCR
36.0000 mg | EXTENDED_RELEASE_TABLET | Freq: Every morning | ORAL | 0 refills | Status: DC
  Filled 2023-11-02 – 2023-12-17 (×3): qty 30, 30d supply, fill #0

## 2023-11-06 ENCOUNTER — Other Ambulatory Visit: Payer: Self-pay

## 2023-11-06 ENCOUNTER — Other Ambulatory Visit (HOSPITAL_BASED_OUTPATIENT_CLINIC_OR_DEPARTMENT_OTHER): Payer: Self-pay

## 2023-11-06 MED ORDER — METRONIDAZOLE 0.75 % EX CREA
1.0000 | TOPICAL_CREAM | Freq: Two times a day (BID) | CUTANEOUS | 2 refills | Status: DC
  Filled 2023-11-06: qty 45, 90d supply, fill #0

## 2023-11-30 ENCOUNTER — Other Ambulatory Visit: Payer: Self-pay

## 2023-11-30 ENCOUNTER — Other Ambulatory Visit (HOSPITAL_BASED_OUTPATIENT_CLINIC_OR_DEPARTMENT_OTHER): Payer: Self-pay

## 2023-12-10 ENCOUNTER — Other Ambulatory Visit (HOSPITAL_BASED_OUTPATIENT_CLINIC_OR_DEPARTMENT_OTHER): Payer: Self-pay

## 2023-12-17 ENCOUNTER — Other Ambulatory Visit: Payer: Self-pay

## 2023-12-17 ENCOUNTER — Other Ambulatory Visit (HOSPITAL_BASED_OUTPATIENT_CLINIC_OR_DEPARTMENT_OTHER): Payer: Self-pay

## 2023-12-21 ENCOUNTER — Other Ambulatory Visit (HOSPITAL_BASED_OUTPATIENT_CLINIC_OR_DEPARTMENT_OTHER): Payer: Self-pay

## 2023-12-21 ENCOUNTER — Encounter: Payer: Self-pay | Admitting: Gastroenterology

## 2023-12-21 ENCOUNTER — Ambulatory Visit: Admitting: Gastroenterology

## 2023-12-21 VITALS — BP 100/70 | HR 97 | Ht 66.0 in | Wt 164.1 lb

## 2023-12-21 DIAGNOSIS — Z800 Family history of malignant neoplasm of digestive organs: Secondary | ICD-10-CM | POA: Diagnosis not present

## 2023-12-21 DIAGNOSIS — K219 Gastro-esophageal reflux disease without esophagitis: Secondary | ICD-10-CM

## 2023-12-21 MED ORDER — ESOMEPRAZOLE MAGNESIUM 40 MG PO CPDR
40.0000 mg | DELAYED_RELEASE_CAPSULE | Freq: Every day | ORAL | 6 refills | Status: AC
  Filled 2023-12-21: qty 30, 30d supply, fill #0

## 2023-12-21 NOTE — Progress Notes (Unsigned)
 GASTROENTEROLOGY OUTPATIENT CLINIC VISIT   Primary Care Provider Patrcia Lynwood RAMAN, MD [address] [place] [address] [phone]  Referring Provider Patrcia Lynwood RAMAN, MD [address] [place],  [place] [address] [phone]  Patient Profile: Brianna Alvarez is a 48 y.o. female with a pmh significant for  The patient presents to the [facility] for an evaluation and management of problem(s) noted below:  Problem List No diagnosis found.  History of Present Illness    The patient does/does not take NSAIDs or BC/Goody Powder. Patient has/has not had an EGD. Patient has/has not had a Colonoscopy.  GI Review of Systems Positive as above Negative for  Pyrosis; Reflux; Regurgitation; Dysphagia; Odynophagia; Globus; Post-prandial cough; Nocturnal cough; Nasal regurgitation; Epigastric pain; Nausea; Vomiting; Hematemesis; Jaundice; Change in Appetite; Early satiety; Abdominal pain; Abdominal bloating; Eructation; Flatulence; Change in BM Frequency; Change in BM Consistency; Constipation; Diarrhea; Incontinence; Urgency; Tenesmus; Hematochezia; Melena  Review of Systems General: Denies fevers/chills/weight loss unintentionally Cardiovascular: Denies chest pain Pulmonary: Denies shortness of breath Gastroenterological: See HPI Genitourinary: Denies darkened urine Hematological: Denies easy bruising/bleeding Endocrine: Denies temperature intolerance Dermatological: Denies jaundice Psychological: Mood is stable  Medications Current Outpatient Medications  Medication Sig Dispense Refill   ALPRAZolam  (XANAX ) 0.25 MG tablet Take 0.25 mg by mouth.     levonorgestrel  (MIRENA ) 20 MCG/24HR IUD 1 each by Intrauterine route once.     methylphenidate  (CONCERTA ) 36 MG PO CR tablet Take 1 tablet (36 mg total) by mouth every morning. 30 tablet 0   No current facility-administered medications for this visit.    Allergies Allergies  Allergen Reactions    Darvocet [Propoxyphene N-Acetaminophen ] Nausea And Vomiting   Dilaudid [Hydromorphone Hcl] Nausea And Vomiting   Hydromorphone Nausea And Vomiting   Sulfathiazole Sodium Swelling    Histories Past Medical History:  Diagnosis Date   Bell's palsy    Kidney stones    Lyme disease, unspecified    Mononucleosis    Tachycardia    Past Surgical History:  Procedure Laterality Date   LASIK     NEPHRECTOMY     2001   Social History   Socioeconomic History   Marital status: Married    Spouse name: Not on file   Number of children: Not on file   Years of education: Not on file   Highest education level: Not on file  Occupational History   Not on file  Tobacco Use   Smoking status: Never   Smokeless tobacco: Never  Vaping Use   Vaping status: Never Used  Substance and Sexual Activity   Alcohol use: Yes    Comment: occas   Drug use: No   Sexual activity: Yes  Other Topics Concern   Not on file  Social History Narrative   Not on file   Social Drivers of Health   Financial Resource Strain: Not on file  Food Insecurity: Low Risk  (06/14/2023)   Received from [facility]   Hunger Vital Sign    Within the past 12 months, you worried that your food would run out before you got money to buy more: Never true    Within the past 12 months, the food you bought just didn't last and you didn't have money to get more. : Never true  Transportation Needs: No Transportation Needs (06/14/2023)   Received from [facility]    In the past 12 months, has lack of reliable transportation kept you from medical appointments, meetings,  work or from getting things needed for daily living? : No  Physical Activity: Not on file  Stress: Not on file  Social Connections: Unknown (06/12/2021)   [facility]   Social Network    Social Network: Not on file  Intimate Partner Violence: Unknown (05/05/2021)   Received from [facility]   HITS    Physically Hurt: Not  on file    Insult or Talk Down To: Not on file    Threaten Physical Harm: Not on file    Scream or Curse: Not on file   Family History  Problem Relation Age of Onset   Colon polyps Mother    Hyperthyroidism Mother    Depression Mother    Cancer Father    Hemachromatosis Father    Colon cancer Maternal Aunt    Breast cancer Maternal Aunt 39 - 49   Breast cancer Maternal Aunt 40 - 49   Colon cancer Maternal Uncle    Esophageal cancer Neg Hx    Stomach cancer Neg Hx    Rectal cancer Neg Hx    I have reviewed her medical, social, and family history in detail and updated the electronic medical record as necessary.    PHYSICAL EXAMINATION  BP 100/70   Pulse 97   Ht 5' 6 (1.676 m)   Wt 164 lb 2 oz (74.4 kg)   BMI 26.49 kg/m  Wt Readings from Last 3 Encounters:  12/21/23 164 lb 2 oz (74.4 kg)  03/17/22 159 lb (72.1 kg)  01/12/22 159 lb 4 oz (72.2 kg)   GEN: NAD, appears stated age, doesn't appear chronically ill PSYCH: Cooperative, without pressured speech EYE: Conjunctivae pink, sclerae anicteric ENT: MMM CV: Nontachycardic RESP: No audible wheezing GI: NABS, soft, NT/ND, without rebound or guarding, no HSM appreciated GU: DRE shows MSK/EXT: No significant lower extremity edema SKIN: No jaundice, no spider angiomata NEURO:  Alert & Oriented x 3, no focal deficits, no evidence of asterixis   REVIEW OF DATA  I reviewed the following data at the time of this encounter:  GI Procedures and Studies  ***  Laboratory Studies  ***  Imaging Studies  ***   ASSESSMENT  Brianna Alvarez is a 49 y.o. female.  The patient is seen today for evaluation and management of:  No diagnosis found.  ***   PLAN  There are no diagnoses linked to this encounter.   No orders of the defined types were placed in this encounter.   New Prescriptions   No medications on file   Modified Medications   No medications on file    Planned Follow Up No follow-ups on file.   Total  Time in Face-to-Face and in Coordination of Care for patient including independent/personal interpretation/review of prior testing, medical history, examination, medication adjustment, communicating results with the patient directly, and documentation within the EHR is ***.   Aloha Finner, MD [facility] Advanced Endoscopy Office # [phone]

## 2023-12-21 NOTE — Patient Instructions (Addendum)
 You have been scheduled for an endoscopy. Please follow written instructions given to you at your visit today.  If you use inhalers (even only as needed), please bring them with you on the day of your procedure.  If you take any of the following medications, they will need to be adjusted prior to your procedure:   DO NOT TAKE 7 DAYS PRIOR TO TEST- Trulicity (dulaglutide) Ozempic, Wegovy (semaglutide) Mounjaro, Zepbound (tirzepatide) Bydureon Bcise (exanatide extended release)  DO NOT TAKE 1 DAY PRIOR TO YOUR TEST Rybelsus (semaglutide) Adlyxin (lixisenatide) Victoza (liraglutide) Byetta (exanatide) ____________________________________________________________   We have sent the following medications to your pharmacy for you to pick up at your convenience: Nexium    _______________________________________________________  If your blood pressure at your visit was 140/90 or greater, please contact your primary care physician to follow up on this.  _______________________________________________________  If you are age 44 or older, your body mass index should be between 23-30. Your Body mass index is 26.49 kg/m. If this is out of the aforementioned range listed, please consider follow up with your Primary Care Provider.  If you are age 60 or younger, your body mass index should be between 19-25. Your Body mass index is 26.49 kg/m. If this is out of the aformentioned range listed, please consider follow up with your Primary Care Provider.   ________________________________________________________  The [facility] providers would like to encourage you to use MYCHART to communicate with providers for non-urgent requests or questions.  Due to long hold times on the telephone, sending your provider a message by [facility] may be a faster and more efficient way to get a response.  Please allow 48 business hours for a response.  Please remember that this is for non-urgent requests.   _______________________________________________________  [facility] is using a team-based approach to care.  Your team is made up of your doctor and two to three APPS. Our APPS (Nurse Practitioners and Physician Assistants) work with your physician to ensure care continuity for you. They are fully qualified to address your health concerns and develop a treatment plan. They communicate directly with your gastroenterologist to care for you. Seeing the Advanced Practice Practitioners on your physician's team can help you by facilitating care more promptly, often allowing for earlier appointments, access to diagnostic testing, procedures, and other specialty referrals.   Thank you for choosing me and [facility].  Dr. Wilhelmenia

## 2023-12-23 ENCOUNTER — Encounter: Payer: Self-pay | Admitting: Gastroenterology

## 2023-12-23 DIAGNOSIS — K219 Gastro-esophageal reflux disease without esophagitis: Secondary | ICD-10-CM | POA: Insufficient documentation

## 2023-12-31 ENCOUNTER — Other Ambulatory Visit: Payer: Self-pay | Admitting: Obstetrics and Gynecology

## 2023-12-31 DIAGNOSIS — Z1231 Encounter for screening mammogram for malignant neoplasm of breast: Secondary | ICD-10-CM

## 2024-01-28 ENCOUNTER — Telehealth: Payer: Self-pay

## 2024-01-28 ENCOUNTER — Other Ambulatory Visit (HOSPITAL_BASED_OUTPATIENT_CLINIC_OR_DEPARTMENT_OTHER): Payer: Self-pay

## 2024-01-28 MED ORDER — METHYLPHENIDATE HCL ER (OSM) 36 MG PO TBCR
36.0000 mg | EXTENDED_RELEASE_TABLET | Freq: Every morning | ORAL | 0 refills | Status: AC
  Filled 2024-01-28: qty 30, 30d supply, fill #0

## 2024-01-28 MED ORDER — ALPRAZOLAM 0.25 MG PO TABS
0.2500 mg | ORAL_TABLET | Freq: Three times a day (TID) | ORAL | 3 refills | Status: AC | PRN
  Filled 2024-01-28: qty 60, 20d supply, fill #0

## 2024-01-28 NOTE — Telephone Encounter (Signed)
 Patient informed that EGD has been denied. EGD has been cxd. Scheduled office follow-up with Dr Wilhelmenia for 03/28/24 at 1:30 pm.

## 2024-01-28 NOTE — Telephone Encounter (Signed)
-----   Message from April M sent at 01/28/2024 10:44 AM EST ----- Regarding: RE: EGD Good Morning Ro, I spoke with [facility] this morning and they did not receive the additional information you faxed over last week to them. They need you to re-fax the PPI trout information along with labs, medications and all other office notes. On the cover sheet please put attention Case #[id number]- fax to [phone]. A peer-to-peer is NOT an option so we will need to R/S her EGD for tomorrow.  Thank you. ----- Message ----- From: Suellen Peers, CMA Sent: 01/23/2024  12:11 PM EST To: April D McPeak Subject: RE: EGD                                        Clinical Information has been faxed to [phone].   Thanks   Ro ----- Message ----- FromBETHA Micki Earing D Sent: 01/22/2024   8:00 AM EST To: Peers Suellen, CMA Subject: EGD                                            Good Morning, [facility] sent us  a letter stating they need more information before approving her EGD(see below)  We need additional information from you to determine if the service is medically necessary by 03/07/24 9AM EST.  In order to process this case, please provide proton pump inhibitor (PPI) trial information (including duration/ start date and dosage with recent update on medication efficacy) related to this [id number] Esophagogastroduodenoscopy (EGD) request. Please contact [facility] and refer to the case # listed above. Please provide the above along with current clinical office notes so that the medical necessity review process can be completed.  Please submit any pertinent clinical information supporting the medical necessity of the services requested by faxing to [phone] Or calling [phone].  Thank you.

## 2024-01-29 ENCOUNTER — Ambulatory Visit

## 2024-01-29 ENCOUNTER — Encounter: Admitting: Gastroenterology

## 2024-01-29 DIAGNOSIS — Z1231 Encounter for screening mammogram for malignant neoplasm of breast: Secondary | ICD-10-CM

## 2024-02-05 ENCOUNTER — Other Ambulatory Visit (HOSPITAL_BASED_OUTPATIENT_CLINIC_OR_DEPARTMENT_OTHER): Payer: Self-pay

## 2024-03-28 ENCOUNTER — Ambulatory Visit: Admitting: Gastroenterology
